# Patient Record
Sex: Male | Born: 1955 | Race: White | Hispanic: No | Marital: Married | Smoking: Former smoker
Health system: Southern US, Community
[De-identification: ages and names within clinical notes are randomized; demographics above are authoritative.]

## PROBLEM LIST (undated history)

## (undated) DIAGNOSIS — N189 Chronic kidney disease, unspecified: Secondary | ICD-10-CM

## (undated) DIAGNOSIS — G20A1 Parkinson's disease without dyskinesia, without mention of fluctuations: Secondary | ICD-10-CM

## (undated) DIAGNOSIS — J449 Chronic obstructive pulmonary disease, unspecified: Secondary | ICD-10-CM

## (undated) DIAGNOSIS — N200 Calculus of kidney: Secondary | ICD-10-CM

## (undated) DIAGNOSIS — E119 Type 2 diabetes mellitus without complications: Secondary | ICD-10-CM

## (undated) DIAGNOSIS — R42 Dizziness and giddiness: Secondary | ICD-10-CM

## (undated) DIAGNOSIS — F419 Anxiety disorder, unspecified: Secondary | ICD-10-CM

## (undated) DIAGNOSIS — G20 Parkinson's disease: Secondary | ICD-10-CM

## (undated) HISTORY — DX: Chronic kidney disease, unspecified: N18.9

## (undated) HISTORY — DX: Type 2 diabetes mellitus without complications: E11.9

## (undated) HISTORY — DX: Anxiety disorder, unspecified: F41.9

## (undated) HISTORY — DX: Parkinson's disease: G20

## (undated) HISTORY — PX: HYDROCELE EXCISION: SHX482

## (undated) HISTORY — DX: Dizziness and giddiness: R42

## (undated) HISTORY — DX: Calculus of kidney: N20.0

## (undated) HISTORY — DX: Chronic obstructive pulmonary disease, unspecified: J44.9

## (undated) HISTORY — DX: Parkinson's disease without dyskinesia, without mention of fluctuations: G20.A1

---

## 2013-08-15 DIAGNOSIS — J40 Bronchitis, not specified as acute or chronic: Secondary | ICD-10-CM | POA: Insufficient documentation

## 2013-08-15 DIAGNOSIS — L03119 Cellulitis of unspecified part of limb: Secondary | ICD-10-CM | POA: Insufficient documentation

## 2013-08-15 DIAGNOSIS — L02419 Cutaneous abscess of limb, unspecified: Secondary | ICD-10-CM | POA: Insufficient documentation

## 2013-08-15 DIAGNOSIS — R000 Tachycardia, unspecified: Secondary | ICD-10-CM | POA: Insufficient documentation

## 2015-09-15 DIAGNOSIS — B029 Zoster without complications: Secondary | ICD-10-CM | POA: Insufficient documentation

## 2015-09-15 DIAGNOSIS — F410 Panic disorder [episodic paroxysmal anxiety] without agoraphobia: Secondary | ICD-10-CM | POA: Insufficient documentation

## 2015-09-15 DIAGNOSIS — E1129 Type 2 diabetes mellitus with other diabetic kidney complication: Secondary | ICD-10-CM | POA: Insufficient documentation

## 2015-09-15 DIAGNOSIS — G950 Syringomyelia and syringobulbia: Secondary | ICD-10-CM | POA: Insufficient documentation

## 2016-02-17 DIAGNOSIS — K219 Gastro-esophageal reflux disease without esophagitis: Secondary | ICD-10-CM | POA: Insufficient documentation

## 2016-03-16 DIAGNOSIS — E1065 Type 1 diabetes mellitus with hyperglycemia: Secondary | ICD-10-CM | POA: Insufficient documentation

## 2016-03-16 DIAGNOSIS — E785 Hyperlipidemia, unspecified: Secondary | ICD-10-CM | POA: Insufficient documentation

## 2016-03-16 DIAGNOSIS — J9601 Acute respiratory failure with hypoxia: Secondary | ICD-10-CM | POA: Insufficient documentation

## 2016-08-13 DIAGNOSIS — J432 Centrilobular emphysema: Secondary | ICD-10-CM | POA: Insufficient documentation

## 2017-05-31 DIAGNOSIS — N433 Hydrocele, unspecified: Secondary | ICD-10-CM | POA: Insufficient documentation

## 2017-11-28 DIAGNOSIS — M67442 Ganglion, left hand: Secondary | ICD-10-CM | POA: Insufficient documentation

## 2017-12-12 DIAGNOSIS — J449 Chronic obstructive pulmonary disease, unspecified: Secondary | ICD-10-CM | POA: Insufficient documentation

## 2018-11-12 DIAGNOSIS — K529 Noninfective gastroenteritis and colitis, unspecified: Secondary | ICD-10-CM | POA: Insufficient documentation

## 2019-04-23 DIAGNOSIS — H8113 Benign paroxysmal vertigo, bilateral: Secondary | ICD-10-CM | POA: Insufficient documentation

## 2019-04-23 DIAGNOSIS — K521 Toxic gastroenteritis and colitis: Secondary | ICD-10-CM | POA: Insufficient documentation

## 2019-09-30 DIAGNOSIS — J189 Pneumonia, unspecified organism: Secondary | ICD-10-CM | POA: Insufficient documentation

## 2019-09-30 DIAGNOSIS — E612 Magnesium deficiency: Secondary | ICD-10-CM | POA: Insufficient documentation

## 2019-09-30 DIAGNOSIS — Z8673 Personal history of transient ischemic attack (TIA), and cerebral infarction without residual deficits: Secondary | ICD-10-CM | POA: Insufficient documentation

## 2019-09-30 DIAGNOSIS — N179 Acute kidney failure, unspecified: Secondary | ICD-10-CM | POA: Insufficient documentation

## 2019-09-30 DIAGNOSIS — I700 Atherosclerosis of aorta: Secondary | ICD-10-CM | POA: Insufficient documentation

## 2019-09-30 DIAGNOSIS — E0810 Diabetes mellitus due to underlying condition with ketoacidosis without coma: Secondary | ICD-10-CM | POA: Insufficient documentation

## 2019-09-30 DIAGNOSIS — I6503 Occlusion and stenosis of bilateral vertebral arteries: Secondary | ICD-10-CM | POA: Insufficient documentation

## 2019-12-23 DIAGNOSIS — M7542 Impingement syndrome of left shoulder: Secondary | ICD-10-CM | POA: Insufficient documentation

## 2020-01-11 DIAGNOSIS — R296 Repeated falls: Secondary | ICD-10-CM | POA: Insufficient documentation

## 2020-01-11 DIAGNOSIS — S3011XA Contusion of abdominal wall, initial encounter: Secondary | ICD-10-CM | POA: Insufficient documentation

## 2020-01-11 DIAGNOSIS — S301XXA Contusion of abdominal wall, initial encounter: Secondary | ICD-10-CM | POA: Insufficient documentation

## 2020-01-11 DIAGNOSIS — R251 Tremor, unspecified: Secondary | ICD-10-CM | POA: Insufficient documentation

## 2020-02-04 DIAGNOSIS — N183 Chronic kidney disease, stage 3 unspecified: Secondary | ICD-10-CM | POA: Insufficient documentation

## 2020-02-04 DIAGNOSIS — I129 Hypertensive chronic kidney disease with stage 1 through stage 4 chronic kidney disease, or unspecified chronic kidney disease: Secondary | ICD-10-CM | POA: Insufficient documentation

## 2020-02-04 DIAGNOSIS — F3341 Major depressive disorder, recurrent, in partial remission: Secondary | ICD-10-CM | POA: Insufficient documentation

## 2020-03-17 DIAGNOSIS — E538 Deficiency of other specified B group vitamins: Secondary | ICD-10-CM | POA: Insufficient documentation

## 2020-03-17 DIAGNOSIS — D649 Anemia, unspecified: Secondary | ICD-10-CM | POA: Insufficient documentation

## 2020-06-01 DIAGNOSIS — G20 Parkinson's disease: Secondary | ICD-10-CM | POA: Insufficient documentation

## 2020-06-01 DIAGNOSIS — I639 Cerebral infarction, unspecified: Secondary | ICD-10-CM | POA: Insufficient documentation

## 2020-06-08 DIAGNOSIS — N3942 Incontinence without sensory awareness: Secondary | ICD-10-CM | POA: Insufficient documentation

## 2020-10-06 DIAGNOSIS — I771 Stricture of artery: Secondary | ICD-10-CM | POA: Insufficient documentation

## 2021-03-27 ENCOUNTER — Other Ambulatory Visit: Payer: Self-pay

## 2021-03-27 ENCOUNTER — Encounter: Payer: Self-pay | Admitting: Neurology

## 2021-03-27 NOTE — Progress Notes (Signed)
Assessment/Plan:   1.  Parkinsonism, likely Parkinson's disease but atypical state cannot be ruled out  -Compliance is an issue here, as patient is not taking levodopa as directed.  He and I had a long discussion about this.  While he may be underdosed, it is difficult to say that is the case given the fact that he really has not taken medication since 6 AM and was seen in the middle of the afternoon.  I am going to try to slightly increase his medication, but told him he needed to take it at 8 AM/noon/4 PM.  He will take carbidopa/levodopa 25/100, 2 tablets at 8 AM/2 tablets at noon/1 tablet at 4 PM.  Discussed r/b/se.  Discussed potential interactions with protein.  -Discussed importance of safe, cardiovascular exercise.  He has therapy in the home right now.  -Discussed with them that Depakote could be making his tremor worse.  Wife states that he was on it for seizure prevention (no history of seizure).  He uses weighted spoons.  I am not sure that he needs the Depakote, but we will leave this to prescribing provider.  2.  Hyperreflexia with frank urinary incontinence (no sensation that he needs to go)   -Was not myelopathic on examination, but did have significant hyperreflexia.  We will do MRI of the cervical spine.  -We will do a redo MRI of the brain given multiple falls and cognitive concerns.  3.  Memory change  -Medications could be playing a role.  We discussed this today.  -Discussed that fluctuating blood sugars could be playing a role as well.  Patient sometimes does not take his diabetes medicines.  -Certainly, PDD could be an issue here as well.  Decided to hold off on neurocognitive testing until we can change his meds, as above.   4.  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  Patient on aspirin monotherapy.  Patient with multiple stroke risk factors including hypertension, hyperlipidemia, diabetes mellitus,  history of tobacco.  5.  Patient's wife does not like driving to [place] and patient currently not driving.  They are going to come back here 1 or 2 more times, primarily for follow-up on MRI and medication change.  After that, patient would like to follow back up in [facility] with Quinn Plowman  Subjective:   Bernard Evans was seen today in the movement disorders clinic for neurologic consultation at the request of Dan Maker., MD.  The consultation is for the evaluation of Parkinson's disease.  Pt with wife who supplements hx.   Patient previously seen by the neurology PA in [facility] as well as Dr. Jobe Gibbon.  Patient first and only visit by Dr. Jobe Gibbon in April, 2022 because of patient's history of stroke and atherosclerosis.  He is a vascular neurologist.  Patient has a history of hypertension, hyperlipidemia, diabetes mellitus and right centrum semiovale stroke in July, 2021.  Aspirin monotherapy for his stroke was recommended.  During that visit in April, 2022, Dr. Jobe Gibbon noted that the patient had falls, resting tremor and he was started on levodopa and a referral was made to the movement clinic at [facility].  He never went there.  He followed back up with Quinn Plowman in August, 2022, at which point he noted that tremor was about 20% better since starting levodopa.  She referred him here at that time, but patient declined the referral then when we contacted them.  Current movement d/o meds: carbidopa/levodopa 25/100, 1 at 6am, 1 at noon, 1 at 6pm - cannot tell wearing off - most of the time he isn't taking the middle of the day dosage and sometimes will take 1 in the morning and 2 at night.  He generally will go back to bed after his wife gives him the morning medicine.  Tremor: Yes.     How long has it been going on? A year  At rest or with activation?  activation  Fam hx of tremor?  Maternal aunt with Parkinsons Disease   Located where?  R hand per patient but wife notes L as  well (pt is R hand dominant)  Affected by caffeine:  No.  Affected by alcohol:  doesn't drink any  Affected by stress:  No.  Affected by fatigue:  No.  Spills soup if on spoon:  Yes.    Spills glass of liquid if full: may or may not   Other Specific Symptoms:  Voice: no change Sleep:   Vivid Dreams:  No.  Acting out dreams:  No. Wet Pillows: No. Postural symptoms:  Yes.  , some due to knee issues, some due to hx of stroke (balance not good after)  Falls?  Yes.  , last fall was last week - fell off kitchen chair.  May go for few weeks without a fall and then may have a few falls in a row Bradykinesia symptoms: slow movements, slowed thought processes, and difficulty getting out of a chair; no shuffle Loss of smell:  No. Loss of taste:  No. Urinary Incontinence:  Yes.  , doesn't see urology lately; wears undergarment.  No sense he has to go Difficulty Swallowing:  No. Handwriting, micrographia: No. Trouble with ADL's:  Yes.   - needs trouble with socks/pants/shoes  Trouble buttoning clothing: Yes.   Depression:  wife states he is "obviously depressed" Memory changes:  Yes.   Since stroke in 2021 Hallucinations:  No.  visual distortions: No. N/V:  No. Lightheaded:  No.  Syncope: No. Diplopia:  No. Dyskinesia:  No.   ALLERGIES:   Allergies  Allergen Reactions   Guaifenesin Shortness Of Breath   Codeine Anxiety    CURRENT MEDICATIONS:  Current Outpatient Medications  Medication Instructions   albuterol (VENTOLIN HFA) 108 (90 Base) MCG/ACT inhaler Inhalation   ALPRAZolam (XANAX) 0.25 MG tablet Oral   amLODipine (NORVASC) 2.5 MG tablet 1 tablet, Oral, Daily   aspirin 325 MG EC tablet 1 tablet, Oral, Daily   Blood Glucose Monitoring Suppl (FIFTY50 GLUCOSE METER 2.0) w/Device KIT See admin instructions   carbidopa-levodopa (SINEMET IR) 25-100 MG tablet 1 tablet, Oral, 3 times daily   cyanocobalamin 1000 MCG tablet Oral   divalproex (DEPAKOTE ER) 500 MG 24 hr tablet 2  tablets, Oral, Daily   empagliflozin (JARDIANCE) 10 MG TABS tablet Oral   ergocalciferol (VITAMIN D2) 1.25 MG (50000 UT) capsule 1 capsule, Oral, Weekly   gabapentin (NEURONTIN) 300 MG capsule TAKE 1 CAPSULE BY MOUTH ONCE DAILY IN THE MORNING AND 2 NIGHTLY   Glycopyrrolate-Formoterol (BEVESPI AEROSPHERE) 9-4.8 MCG/ACT AERO Inhalation   insulin degludec (TRESIBA) 100 UNIT/ML FlexTouch Pen Inject 16 units daily and 10 units at bedtime   insulin glargine (LANTUS SOLOSTAR) 100 UNIT/ML Solostar Pen Increase dose by 5 unit wkly until fasting around 120.   losartan (COZAAR) 50 MG tablet 1 tablet, Oral, 2 times daily   magnesium oxide (MAG-OX) 400 MG tablet Take 1 tablet by mouth on Monday, Wednesday and  Friday.   metFORMIN (GLUCOPHAGE-XR) 500 MG 24 hr tablet 2 tablets, Oral, Daily with breakfast   metoprolol succinate (TOPROL-XL) 25 MG 24 hr tablet 2 tablets, Oral, Daily   pantoprazole (PROTONIX) 40 MG tablet 1 tablet, Oral, Daily   rosuvastatin (CRESTOR) 20 MG tablet 1 tablet, Oral, Daily   venlafaxine (EFFEXOR) 75 MG tablet Take by mouth.    Objective:   PHYSICAL EXAMINATION:    VITALS:   Vitals:   03/28/21 1308  BP: 119/69  Pulse: 69  SpO2: 98%  Weight: 180 lb (81.6 kg)  Height: $Remove'5\' 9"'IYiUFyW$  (1.753 m)    GEN:  The patient appears stated age and is in NAD. HEENT:  Normocephalic, atraumatic.  The mucous membranes are moist. The superficial temporal arteries are without ropiness or tenderness. CV:  RRR Lungs:  CTAB Neck/HEME:  There are no carotid bruits bilaterally.  Pt did not take the carbidopa/levodopa 25/100 since early this AM (6am) and was seen at 1:30pm.  Neurological examination:  Orientation: The patient is alert and oriented x3.  Patient looks to wife for finer aspects of the history. Cranial nerves: There is good facial symmetry.  There is facial hypomimia.  Extraocular muscles are intact. The visual fields are full to confrontational testing. The speech is fluent and clear.  Soft palate rises symmetrically and there is no tongue deviation. Hearing is intact to conversational tone. Sensation: Sensation is intact to light touch throughout (facial, trunk, extremities). Vibration is intact at the bilateral big toe. There is no extinction with double simultaneous stimulation.  Motor: Strength is 5/5 in the bilateral upper and lower extremities.   Shoulder shrug is equal and symmetric.  There is no pronator drift. Deep tendon reflexes: Deep tendon reflexes are 3+/4 at the bilateral biceps, triceps, brachioradialis, 2+ at the bilateralpatella and achilles. Plantar responses are downgoing bilaterally.  Movement examination: Tone: There is mild increased tone in the RUE/RLE.  While the left arm is held in a flexed posture, it really is not rigid. Abnormal movements: there is RUE rest tremor that increases with distraction Coordination:  There is  decremation with RAM's, with finger taps and hand opening and closing on the right.  He has trouble with toe and heel taps more on the left than the right. Gait and Station: The patient has difficulty arising out of a deep-seated chair without the use of the hands and is unable to do that.  He ultimately pushes off of the chair to arise. The patient is slow with his cane, but when the cane is taken away, he becomes much more short stepped and flexed at the waist and starts to festinate some.   Total time spent on today's visit was 39minutes, including both face-to-face time and nonface-to-face time.  Time included that spent on review of records (prior notes available to me/labs/imaging if pertinent), discussing treatment and goals, answering patient's questions and coordinating care.  Cc:  Myrtis Hopping., MD

## 2021-03-28 ENCOUNTER — Encounter: Payer: Self-pay | Admitting: Neurology

## 2021-03-28 ENCOUNTER — Ambulatory Visit (INDEPENDENT_AMBULATORY_CARE_PROVIDER_SITE_OTHER): Payer: Medicare Other | Admitting: Neurology

## 2021-03-28 ENCOUNTER — Other Ambulatory Visit: Payer: Self-pay

## 2021-03-28 VITALS — BP 119/69 | HR 69 | Ht 69.0 in | Wt 180.0 lb

## 2021-03-28 DIAGNOSIS — G20 Parkinson's disease: Secondary | ICD-10-CM

## 2021-03-28 DIAGNOSIS — W19XXXA Unspecified fall, initial encounter: Secondary | ICD-10-CM | POA: Diagnosis not present

## 2021-03-28 DIAGNOSIS — R410 Disorientation, unspecified: Secondary | ICD-10-CM

## 2021-03-28 DIAGNOSIS — R32 Unspecified urinary incontinence: Secondary | ICD-10-CM

## 2021-03-28 DIAGNOSIS — R292 Abnormal reflex: Secondary | ICD-10-CM

## 2021-03-28 MED ORDER — CARBIDOPA-LEVODOPA 25-100 MG PO TABS: ORAL_TABLET | ORAL | 0 refills | Status: DC

## 2021-03-28 NOTE — Patient Instructions (Addendum)
Carbidopa Levodopa 25/100 Take 2 tablets at 8AM / Take 2 tablets at 12PM and Take 1 tablet at 4PM  A referral to [facility] has been placed for your MRI someone will contact you directly to schedule your appt. They are located at [place]. Please contact them directly by calling [phone] with any questions regarding your referral.

## 2021-04-18 ENCOUNTER — Other Ambulatory Visit: Payer: Self-pay

## 2021-04-18 ENCOUNTER — Ambulatory Visit
Admission: RE | Admit: 2021-04-18 | Discharge: 2021-04-18 | Disposition: A | Discharge status: Home / Self Care | Payer: Medicare Other | Source: Ambulatory Visit | Attending: Neurology | Admitting: Neurology

## 2021-04-18 DIAGNOSIS — R292 Abnormal reflex: Secondary | ICD-10-CM

## 2021-04-18 DIAGNOSIS — R32 Unspecified urinary incontinence: Secondary | ICD-10-CM

## 2021-04-18 DIAGNOSIS — R410 Disorientation, unspecified: Secondary | ICD-10-CM

## 2021-04-18 DIAGNOSIS — W19XXXA Unspecified fall, initial encounter: Secondary | ICD-10-CM

## 2021-04-18 DIAGNOSIS — G20 Parkinson's disease: Secondary | ICD-10-CM

## 2021-04-18 IMAGING — MR MR CERVICAL SPINE W/O CM
4 of 5 series · 27 of 48 positions shown · non-contrast
Comparison: CT cervical spine [DATE].

CLINICAL DATA: Urinary incontinence, unspecified type R32
([RM]-CM). Hyperreflexia [RM] ([RM]-CM).

EXAM:
MRI CERVICAL SPINE WITHOUT CONTRAST
TECHNIQUE: Multiplanar, multisequence MR imaging of the cervical spine was
performed. No intravenous contrast was administered.

[Series 6: T2 · sagittal · 3.0mm · 0.55mm/px · 6 of 16 slices shown (1 of 2)]
[im 1/16]
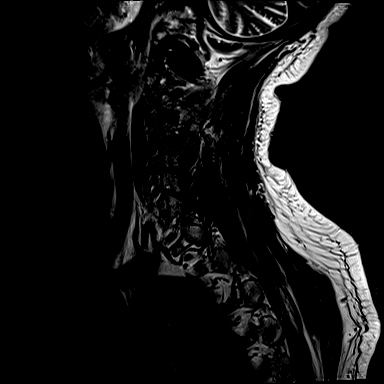
[im 4/16]
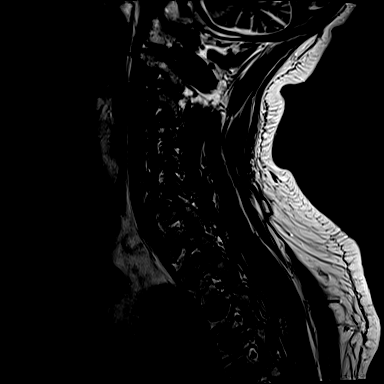
[im 7/16]
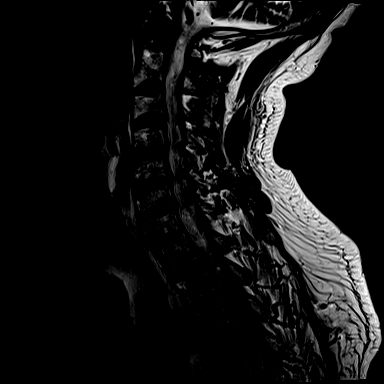
[im 10/16]
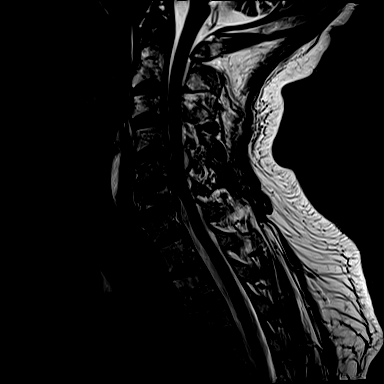
[im 13/16]
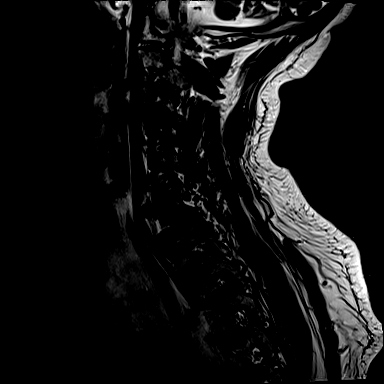
[im 16/16]
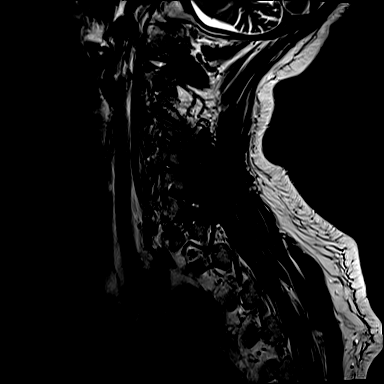

[Series 7: T1 · sagittal · 3.0mm · 0.66mm/px · 7 of 16 slices shown]
[im 1/16]
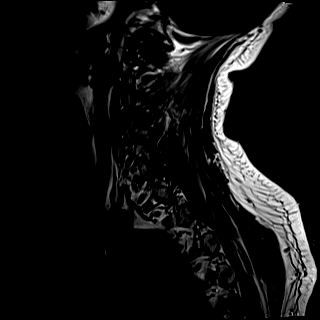
[im 3/16]
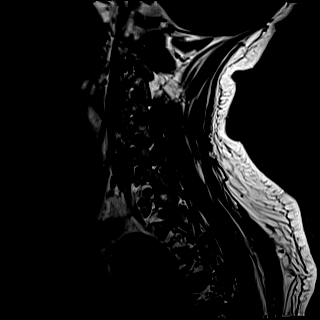
[im 6/16]
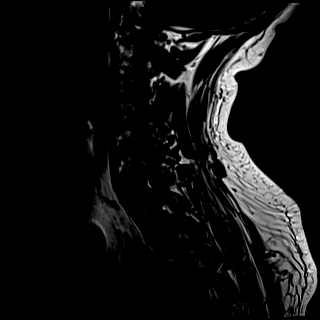
[im 8/16]
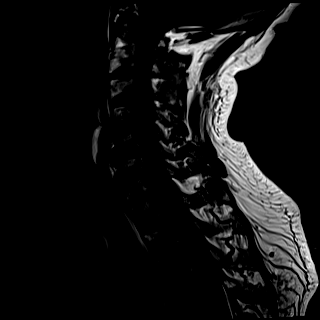
[im 11/16]
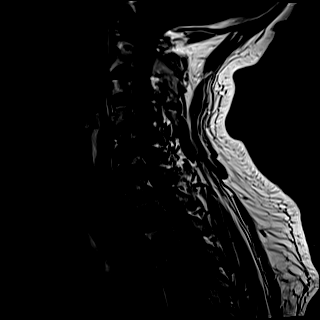
[im 13/16]
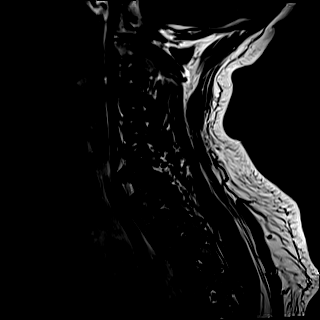
[im 16/16]
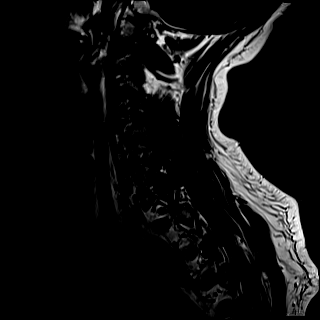

[Series 8: STIR · sagittal · 3.0mm · 0.33mm/px · 6 of 16 slices shown]
[im 1/16]
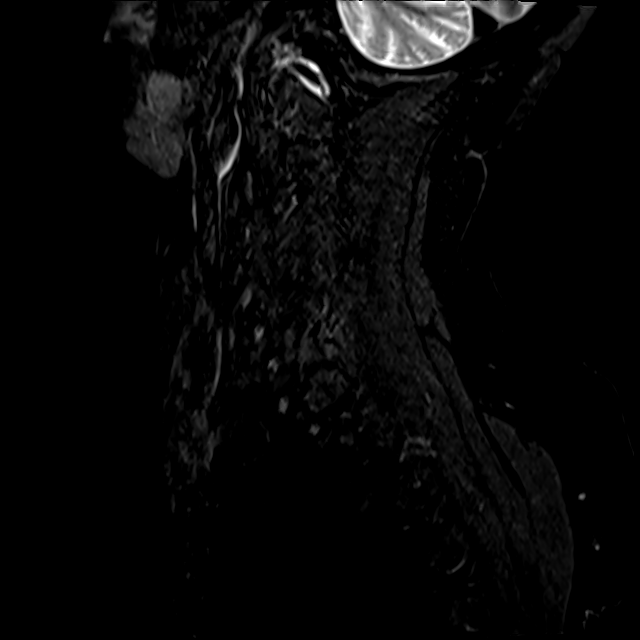
[im 3/16]
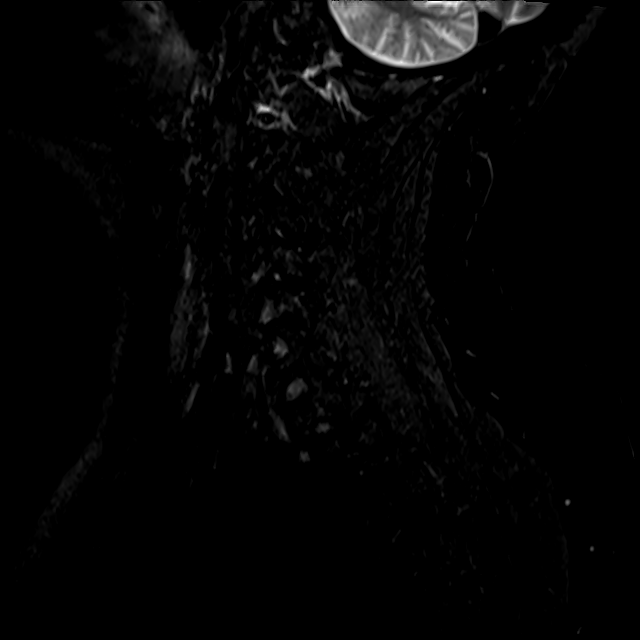
[im 6/16]
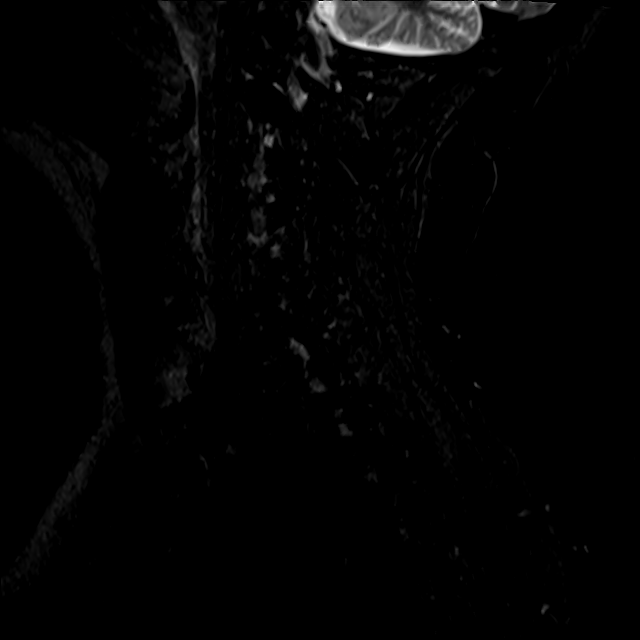
[im 8/16]
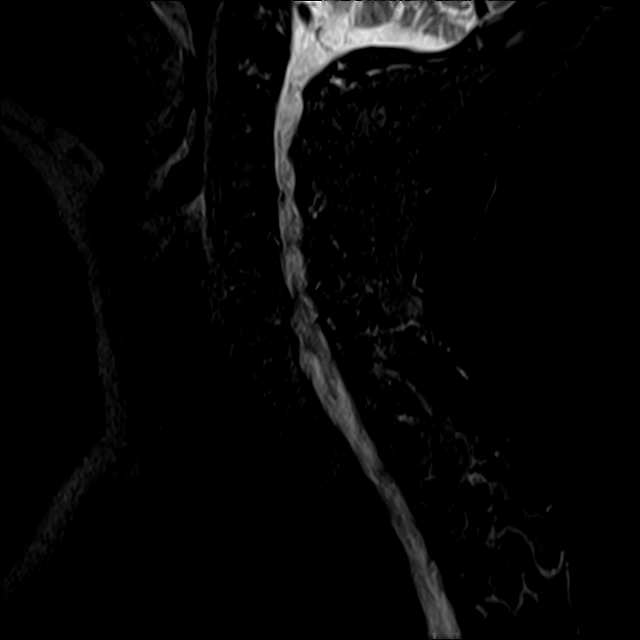
[im 11/16]
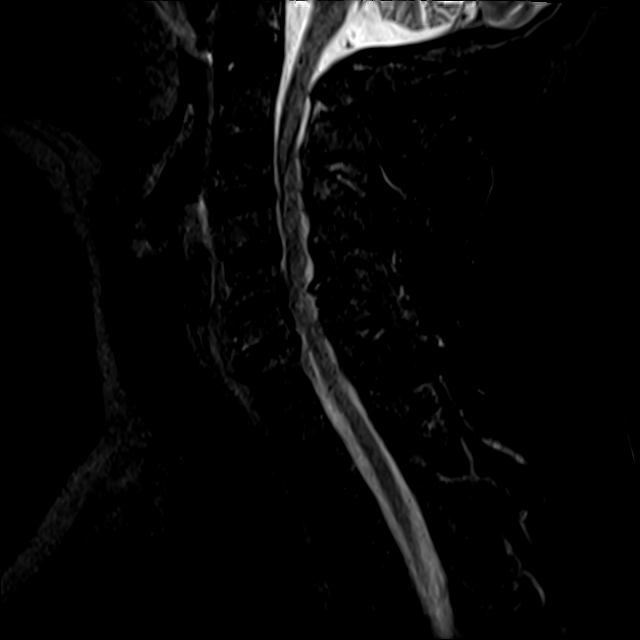
[im 13/16]
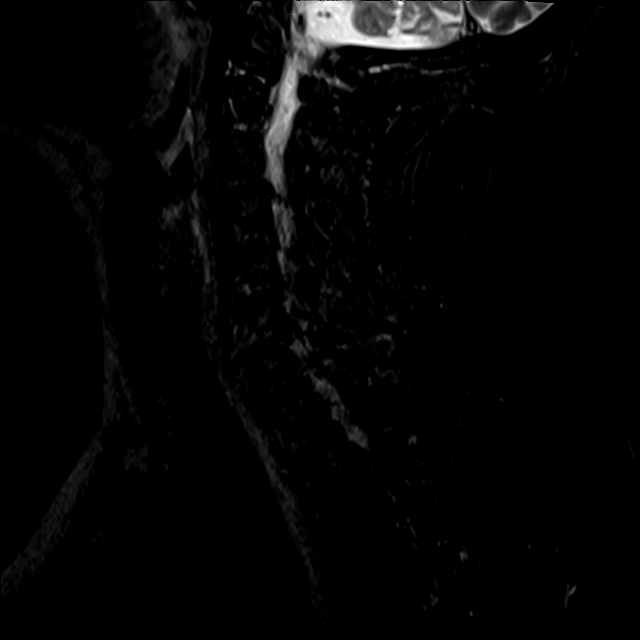

[Series 9: T2 · axial · 3.0mm · 0.50mm/px · z∈[-237,-144]mm · 8 of 31 slices shown (2 of 2)]
[im 1/31]
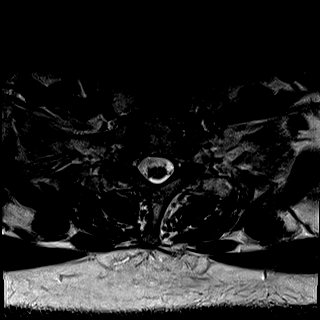
[im 5/31]
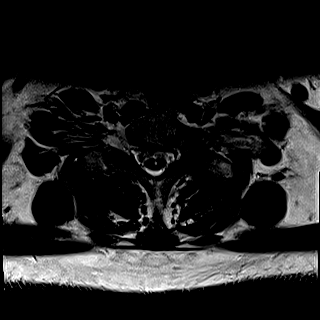
[im 10/31]
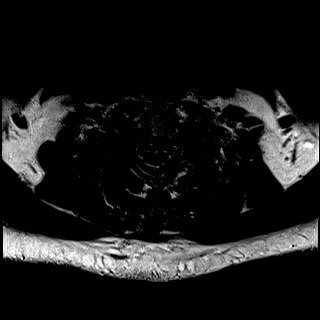
[im 14/31]
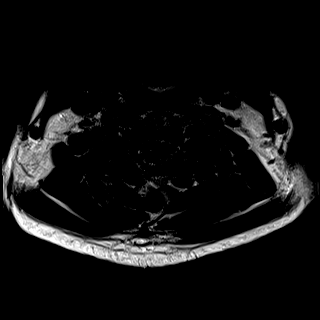
[im 17/31]
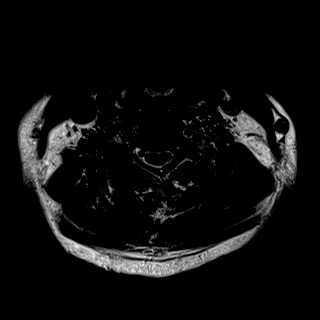
[im 21/31]
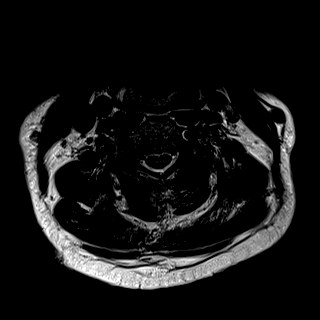
[im 26/31]
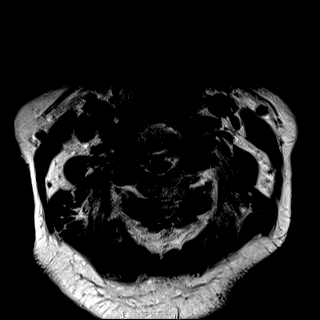
[im 31/31]
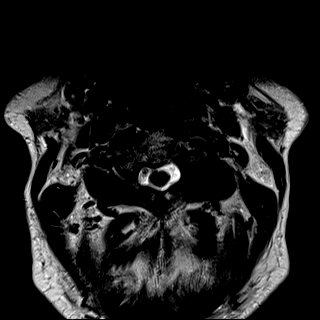

[27 of 48 positions shown; findings below may reference images not displayed]

FINDINGS: Alignment: Physiologic.

Vertebrae: No fracture or evidence of discitis. Ill-defined
hypointense focus in all sequences in the T2 vertebral body may
represent focal red marrow.

Cord: Persistent central canal seen at the C7-T1 level.

Posterior Fossa, vertebral arteries, paraspinal tissues: Negative.

Disc levels:

C2-3: Tiny central disc protrusion. No significant spinal canal or
neural foraminal stenosis.

C3-4: Posterior disc osteophyte complex resulting in mild spinal
canal stenosis. Uncovertebral and facet degenerative changes
resulting in mild-to-moderate right and moderate left neural
foraminal narrowing.

C4-5: Posterior disc osteophyte complex resulting in mild spinal
canal stenosis. Uncovertebral and facet degenerative changes
resulting mild right and moderate left neural foraminal narrowing.

C5-6: Posterior disc osteophyte complex resulting in mild spinal
canal stenosis. Uncovertebral and facet degenerative changes
resulting in moderate bilateral neural foraminal narrowing.

C6-7: Posterior disc osteophyte complex asymmetric to the left
without significant spinal canal stenosis. Uncovertebral and facet
degenerative change resulting in mild right and moderate left neural
foraminal narrowing.

C7-T1: Uncovertebral and facet degenerative changes resulting in
mild right and moderate left neural foraminal narrowing. No spinal
canal stenosis.
IMPRESSION: 1. Degenerative changes of the cervical spine with mild spinal canal
stenosis from C3-4 through C5-6.
2. Multilevel neural foraminal narrowing, moderate bilaterally at
C5-6 and on the left at C4-5, C6-7 and C7-T1.

## 2021-04-18 IMAGING — MR MR HEAD W/O CM
12 series · 48 of 48 positions shown · non-contrast
Comparison: Head CT [DATE]; MRI of the brain [DATE].

CLINICAL DATA: Parkinson's disease (HCC) G20 ([5S]-CM). Confusion
[5S] ([5S]-CM). Fall, initial encounter W19.XXXA ([5S]-CM).
Hyperreflexia [5S] ([5S]-CM).

EXAM:
MRI HEAD WITHOUT CONTRAST
TECHNIQUE: Multiplanar, multiecho pulse sequences of the brain and surrounding
structures were obtained without intravenous contrast.

[Series 5: T1 · sagittal · 4.0mm · 0.75mm/px · 1 of 32 slices shown (1 of 2)]
[im 1/32]
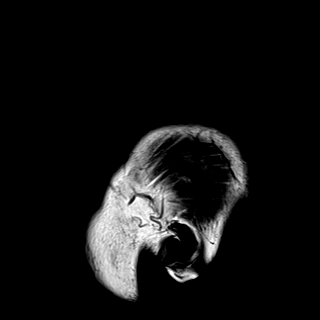

[Series 6: DWI · axial · 3.0mm · 0.94mm/px · z∈[-59,+88]mm · 9 of 168 slices shown (1 of 3)]
[im 1/168]
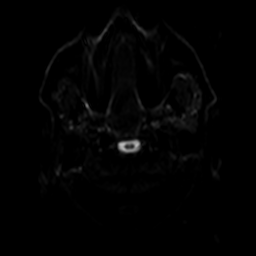
[im 21/168]
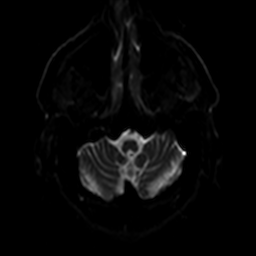
[im 42/168]
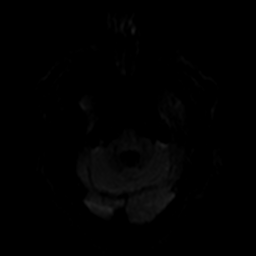
[im 63/168]
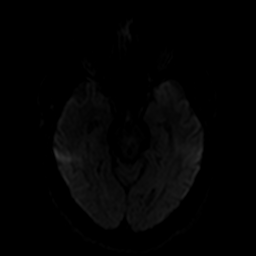
[im 84/168]
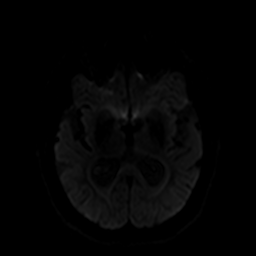
[im 105/168]
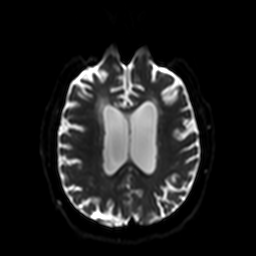
[im 126/168]
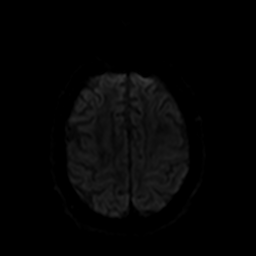
[im 147/168]
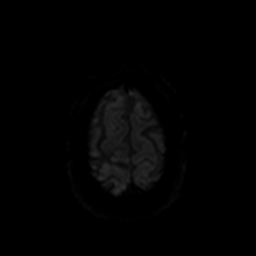
[im 168/168]
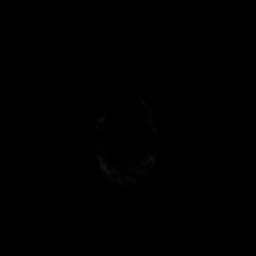

[Series 7: ax dwi_tracew · axial · 3.0mm · 0.94mm/px · z∈[-59,+88]mm · 5 of 84 slices shown]
[im 1/84]
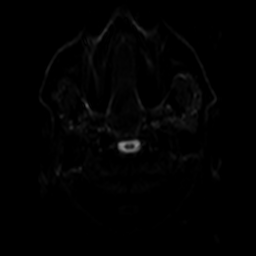
[im 21/84]
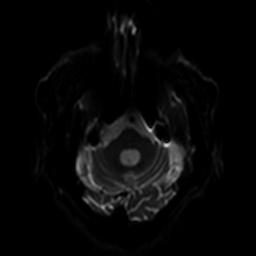
[im 42/84]
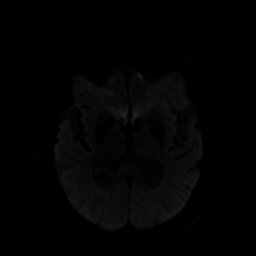
[im 63/84]
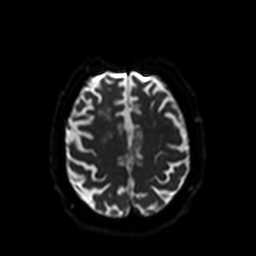
[im 84/84]
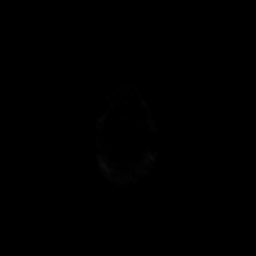

[Series 8: ax dwi_adc · axial · 3.0mm · 0.94mm/px · z∈[-59,+88]mm · 2 of 43 slices shown]
[im 1/43]
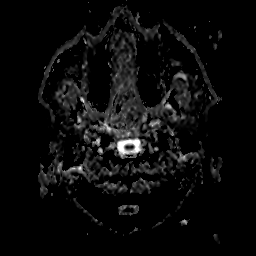
[im 43/43]
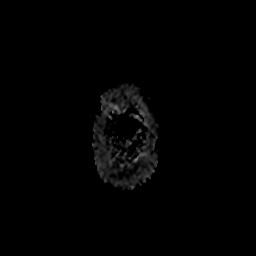

[Series 9: DWI · coronal · 5.0mm · 1.44mm/px · 4 of 64 slices shown (2 of 3)]
[im 1/64]
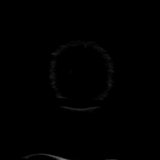
[im 22/64]
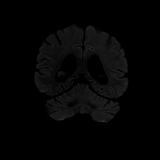
[im 43/64]
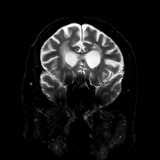
[im 64/64]
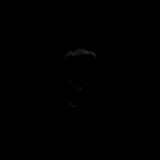

[Series 10: DWI · coronal · 5.0mm · 1.44mm/px · 2 of 32 slices shown (3 of 3)]
[im 1/32]
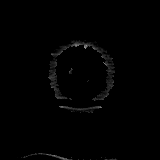
[im 32/32]
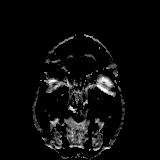

[Series 11: T2 · axial · 4.0mm · 0.36mm/px · z∈[-61,+91]mm · 2 of 31 slices shown]
[im 1/31]
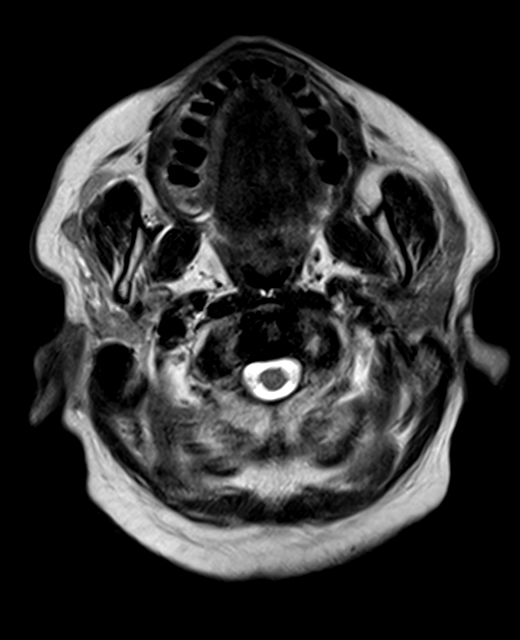
[im 31/31]
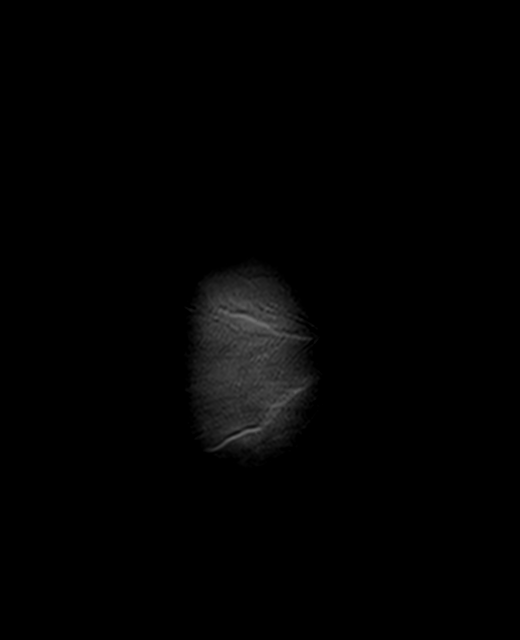

[Series 12: FLAIR · axial · 3.0mm · 0.72mm/px · 1 of 26 slices shown]
[im 1/26]
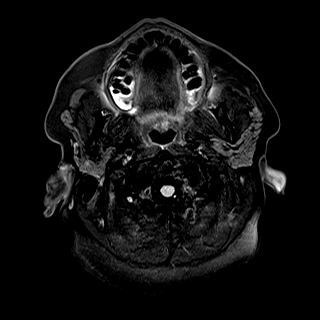

[Series 13: swi_images · axial · 1.5mm · 0.90mm/px · z∈[-60,+90]mm · 6 of 104 slices shown]
[im 1/104]
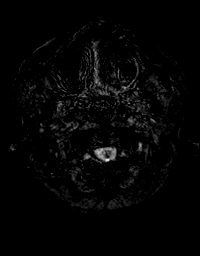
[im 21/104]
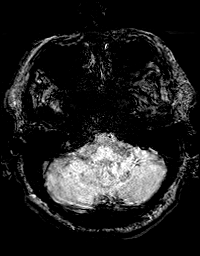
[im 42/104]
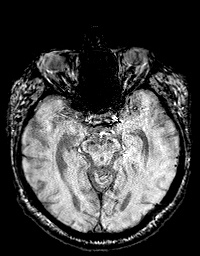
[im 62/104]
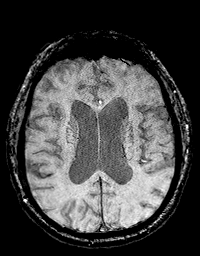
[im 83/104]
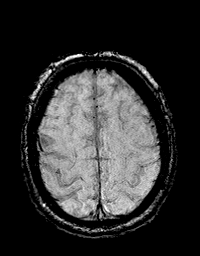
[im 104/104]
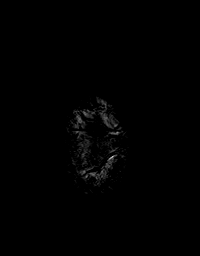

[Series 14: mip_images(sw) · axial · 12.0mm · 0.90mm/px · z∈[-55,+85]mm · 5 of 97 slices shown]
[im 1/97]
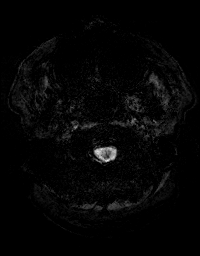
[im 25/97]
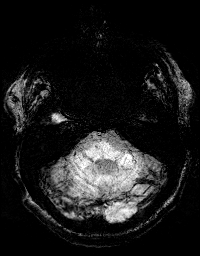
[im 49/97]
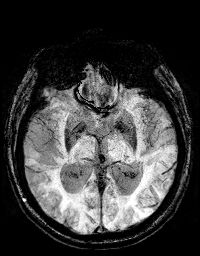
[im 73/97]
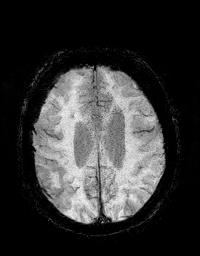
[im 97/97]
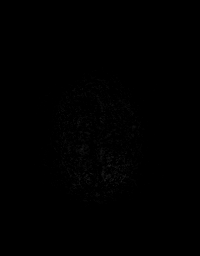

[Series 15: T1 · axial · 1.0mm · 0.94mm/px · z∈[-61,+93]mm · 9 of 160 slices shown (2 of 2)]
[im 1/160]
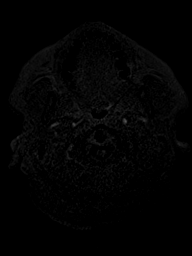
[im 20/160]
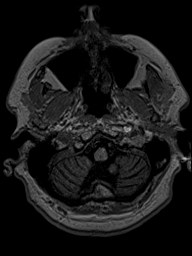
[im 40/160]
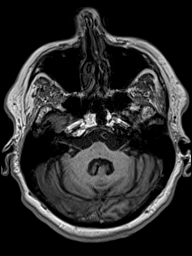
[im 60/160]
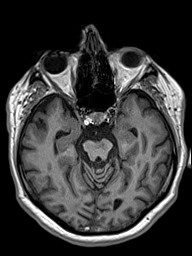
[im 80/160]
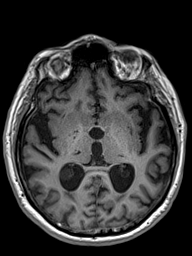
[im 100/160]
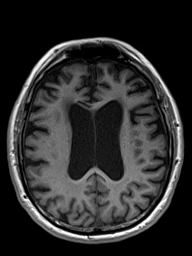
[im 120/160]
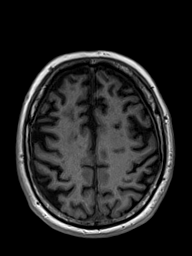
[im 140/160]
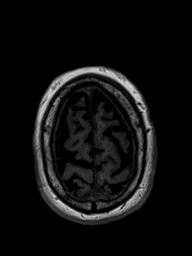
[im 160/160]
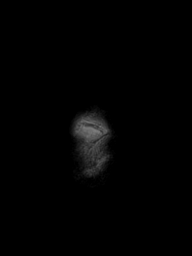

[Series 16: T2 post-contrast · coronal · 4.0mm · 0.36mm/px · 2 of 35 slices shown]
[im 1/35]
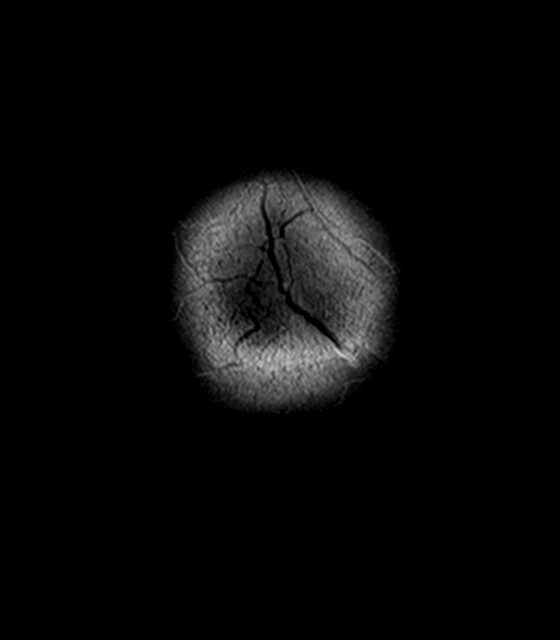
[im 35/35]
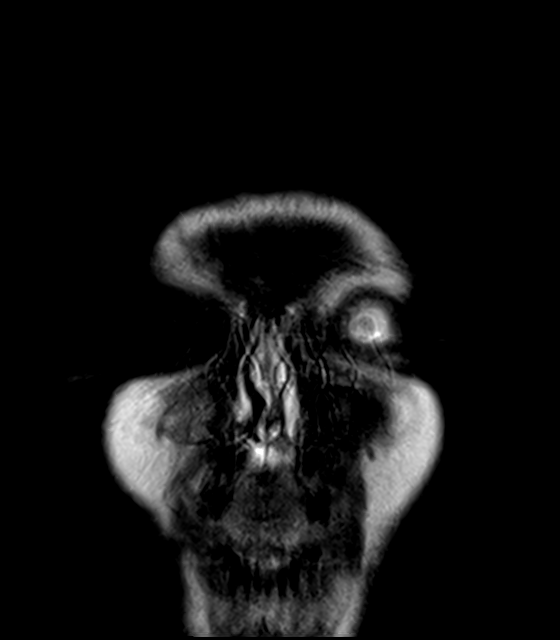

[48 of 48 positions shown; findings below may reference images not displayed]

FINDINGS: Brain: No acute infarction, hemorrhage, hydrocephalus, extra-axial
collection or mass lesion. Remote lacunar infarcts within the left
cerebellar hemisphere, pons, thalami, basal ganglia, centrum
semiovale and corona radiata as well as genu and splenium of the
corpus callosum. Infarct in the genu of the corpus callosum is new
since prior MRI. Scattered and confluent foci of T2 hyperintensity
are seen within the white matter of the cerebral hemispheres,
nonspecific, most likely related to chronic small vessel ischemia.
Moderate parenchymal volume loss.

Vascular: Absent flow void in the distal V4 segment of the left
vertebral artery, consistent with occlusion as seen on prior CT
angiogram.

Skull and upper cervical spine: Normal marrow signal.

Sinuses/Orbits: Negative.

Other: None.
IMPRESSION: 1. No acute intracranial abnormality.
2. Multiple remote small infarcts with at least 1 new infarct since
MRI performed in [DATE]. Moderate chronic microvascular ischemic changes of the white
matter and parenchymal volume loss.

## 2021-04-20 ENCOUNTER — Other Ambulatory Visit: Payer: Self-pay

## 2021-04-20 DIAGNOSIS — R32 Unspecified urinary incontinence: Secondary | ICD-10-CM

## 2021-05-31 NOTE — Progress Notes (Signed)
? ? ?Assessment/Plan:  ? ?1.  Parkinsonism, likely Parkinson's disease but atypical state cannot be ruled out ?            -Clinically, I still think he likely has Parkinson's disease.  He needs to increase his exercise.  We talked about this extensively.   ?-He will take carbidopa/levodopa 25/100, 2 tablets at 8 AM/2 tablets at noon/1 tablet at 4 PM.  Discussed r/b/se.  Discussed potential interactions with protein. ?-I will add pramipexole and work to 0.5 mg 3 times per day.  R/b/se discussed with patient and wife. ?            -Discussed with them that Depakote could be making his tremor worse.  Wife states that he was on it for seizure prevention (no history of seizure).  He uses weighted spoons.  I am not sure that he needs the Depakote, but we will leave this to prescribing provider. ? -may do levodopa challenge in future. ?  ?  ?2.  Memory change ?            -Medications could be playing a role.  We discussed this today. ?            -Discussed that fluctuating blood sugars could be playing a role as well.  Patient sometimes does not take his diabetes medicines. ?            -We will watch closely as we add pramipexole.  Discussed with wife. ?  ?  ?3.  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            -Patient has seen stroke neurology at [facility].  Patient on aspirin monotherapy.  Patient with multiple stroke risk factors including hypertension, hyperlipidemia, diabetes mellitus, history of tobacco. ? -MRI brain in 2023 with evidence of multiple prior infarcts. ?  ?4.  After I had left and was standing in the hall, the patient's wife stated that they would like to stay here for the care and asked me if I could refill the Depakote.  Told her I would need to examine the chart, because I remember having reservations about the Depakote.  First, it can make tremor worse.  Second, he was told he was on it for seizure prevention when he had no history of seizure.  He was already having to use  weighted spoons because of tremor.  I am going to try to decrease this medication to 500 mg daily and potentially decrease this further in the future.  We will let his wife know. ? ? ?Subjective:  ? ?Bernard Evans was seen today in follow up for parkinsonism, likely Parkinson's disease.  My previous records were reviewed prior to todays visit as well as outside records available to me.  I slightly increased his levodopa last visit, but most importantly we talked about compliance with taking the medication, as that had been an issue.  Records from primary care from March 27 indicate that patient compliance continues to be an issue. pt has had falls since last visit.  With the one, he was getting up from the kitchen chair and fell backwards.  With one, he is trying to sit down on the toilet and missed it and fell. One other fall, he was getting out of the shower and slipped and fell.  Didn't get hurt.   Pt denies lightheadedness, near syncope.  No hallucinations.  He did have an MRI of the brain and  cervical spine done since last visit.  MRI cervical spine with evidence of degenerative changes, multilevel, with mild central canal stenosis.  There was moderate neuroforaminal stenosis at several levels.  MRI brain reviewed.  Intracranially, there was nothing acute.  There was moderate white matter disease.  There was multiple old infarcts noted.  I personally reviewed those images.  Wife states that the medication doesn't seem to help that much.   He is taking it regularly.  He is not exercising. ? ?Current prescribed movement disorder medications: ?Carbidopa/levodopa 25/100, 2 tablets at 8 AM/2 tablets at noon/1 tablet at 4 PM ? ? ?PREVIOUS MEDICATIONS: Sinemet ? ?ALLERGIES:   ?Allergies  ?Allergen Reactions  ? Guaifenesin Shortness Of Breath  ? Codeine Anxiety  ? ? ?CURRENT MEDICATIONS:  ?Current Meds  ?Medication Sig  ? albuterol (VENTOLIN HFA) 108 (90 Base) MCG/ACT inhaler Inhale into the lungs.  ? ALPRAZolam  (XANAX) 0.25 MG tablet Take by mouth.  ? aspirin 325 MG EC tablet Take 1 tablet by mouth daily.  ? Blood Glucose Monitoring Suppl (FIFTY50 GLUCOSE METER 2.0) w/Device KIT See admin instructions.  ? carbidopa-levodopa (SINEMET IR) 25-100 MG tablet Take 2 tablets at 8AM / Take 2 tablets at 12PM and Take 1 tablet at 4PM  ? cyanocobalamin 1000 MCG tablet Take by mouth.  ? divalproex (DEPAKOTE ER) 500 MG 24 hr tablet Take 2 tablets by mouth daily.  ? empagliflozin (JARDIANCE) 10 MG TABS tablet Take by mouth.  ? ergocalciferol (VITAMIN D2) 1.25 MG (50000 UT) capsule Take 1 capsule by mouth once a week.  ? gabapentin (NEURONTIN) 300 MG capsule TAKE 1 CAPSULE BY MOUTH ONCE DAILY IN THE MORNING AND 2 NIGHTLY  ? Glycopyrrolate-Formoterol (BEVESPI AEROSPHERE) 9-4.8 MCG/ACT AERO Inhale into the lungs.  ? insulin degludec (TRESIBA) 100 UNIT/ML FlexTouch Pen Inject 16 units daily and 10 units at bedtime  ? insulin glargine (LANTUS SOLOSTAR) 100 UNIT/ML Solostar Pen Increase dose by 5 unit wkly until fasting around 120.  ? losartan (COZAAR) 50 MG tablet Take 1 tablet by mouth 2 (two) times daily.  ? magnesium oxide (MAG-OX) 400 MG tablet Take 1 tablet by mouth on Monday, Wednesday and Friday.  ? metFORMIN (GLUCOPHAGE-XR) 500 MG 24 hr tablet Take 2 tablets by mouth daily with breakfast.  ? metoprolol succinate (TOPROL-XL) 25 MG 24 hr tablet Take 2 tablets by mouth daily.  ? pantoprazole (PROTONIX) 40 MG tablet Take 1 tablet by mouth daily.  ? rosuvastatin (CRESTOR) 20 MG tablet Take 1 tablet by mouth daily.  ? venlafaxine (EFFEXOR) 75 MG tablet Take by mouth.  ? ? ? ?Objective:  ? ?PHYSICAL EXAMINATION:   ? ?VITALS:   ?Vitals:  ? 06/05/21 1404  ?BP: 123/78  ?Pulse: 72  ?SpO2: 96%  ?Weight: 180 lb (81.6 kg)  ?Height: $RemoveB'5\' 7"'JyqVRsEq$  (1.702 m)  ? ? ?GEN:  The patient appears stated age and is in NAD. ?HEENT:  Normocephalic, atraumatic.  The mucous membranes are moist. The superficial temporal arteries are without ropiness or tenderness. ?CV:   RRR ?Lungs:  CTAB ?Neck/HEME:  There are no carotid bruits bilaterally. ? ?Neurological examination: ? ?Orientation: The patient is alert and oriented x3.  Patient looks to wife for finer aspects of the history. ?Cranial nerves: There is good facial symmetry.  There is facial hypomimia.  Extraocular muscles are intact. The visual fields are full to confrontational testing. The speech is fluent and clear. Soft palate rises symmetrically and there is no tongue deviation. Hearing is intact to  conversational tone. ?Sensation: Sensation is intact to light touch throughout  ?Motor: Strength is 5/5 in the bilateral upper and lower extremities.   Shoulder shrug is equal and symmetric.  There is no pronator drift. ?Deep tendon reflexes: Deep tendon reflexes are 3+/4 at the bilateral biceps, triceps, brachioradialis, 2+ at the bilateralpatella and achilles. Plantar responses are downgoing bilaterally. ?  ?Movement examination: ?Tone: There is nl tone in the UE/LE ?Abnormal movements: there is RUE rest tremor that increases with distraction ?Coordination:  There is  decremation with action of turning in light bulb on the L and toe taps on the L ?Gait and Station: The patient pushes off of the chair to arise. The patient is slow with his cane and short stepped. ? ? ? ? ?Total time spent on today's visit was 27minutes, including both face-to-face time and nonface-to-face time.  Time included that spent on review of records (prior notes available to me/labs/imaging if pertinent), discussing treatment and goals, answering patient's questions and coordinating care. ? ?Cc:  Myrtis Hopping., MD ? ?

## 2021-06-03 ENCOUNTER — Other Ambulatory Visit: Payer: Self-pay | Admitting: Neurology

## 2021-06-03 DIAGNOSIS — G20 Parkinson's disease: Secondary | ICD-10-CM

## 2021-06-05 ENCOUNTER — Ambulatory Visit (INDEPENDENT_AMBULATORY_CARE_PROVIDER_SITE_OTHER): Payer: Medicare Other | Admitting: Neurology

## 2021-06-05 ENCOUNTER — Other Ambulatory Visit: Payer: Self-pay

## 2021-06-05 ENCOUNTER — Telehealth: Payer: Self-pay | Admitting: Neurology

## 2021-06-05 ENCOUNTER — Encounter: Payer: Self-pay | Admitting: Neurology

## 2021-06-05 VITALS — BP 123/78 | HR 72 | Ht 67.0 in | Wt 180.0 lb

## 2021-06-05 DIAGNOSIS — G20 Parkinson's disease: Secondary | ICD-10-CM

## 2021-06-05 MED ORDER — PRAMIPEXOLE DIHYDROCHLORIDE 0.5 MG PO TABS: 0.5000 mg | ORAL_TABLET | Freq: Three times a day (TID) | ORAL | 1 refills | Status: DC

## 2021-06-05 MED ORDER — PRAMIPEXOLE DIHYDROCHLORIDE 0.125 MG PO TABS: ORAL_TABLET | ORAL | 0 refills | Status: DC

## 2021-06-05 NOTE — Telephone Encounter (Signed)
Called pateint left voicemail message  

## 2021-06-05 NOTE — Patient Instructions (Addendum)
Start mirapex (pramipexole) as follows:  0.125 mg - 1 tablet three times per day for a week, then 2 tablets three times per day for a week and then fill the 0.5 mg tablet and take that, 1 pill three times per day  ? ?Let us know if you want a referral for PT. ? ? ? ?

## 2021-06-05 NOTE — Telephone Encounter (Signed)
After I had left and was standing in the hall, the patient's wife stated that they would like to stay here for the care and asked me if I could refill the Depakote.  Told her I would need to examine the chart, because I remember having reservations about the Depakote.  First, it can make tremor worse.  Second, he was told he was on it for seizure prevention when he had no history of seizure.  He was already having to use weighted spoons because of tremor.  I am going to try to decrease this medication to 500 mg daily and potentially decrease this further in the future.  Chelsea, let wife know that I am going to decrease med to 1 tablet instead of 2 at night.  Tell her why.  I'm not so sure he needs medication but not stopping it yet.  Go ahead and refill if he needs it for 1 daily ?

## 2021-06-06 ENCOUNTER — Other Ambulatory Visit: Payer: Self-pay

## 2021-06-06 DIAGNOSIS — G20A1 Parkinson's disease without dyskinesia, without mention of fluctuations: Secondary | ICD-10-CM

## 2021-06-06 DIAGNOSIS — G20 Parkinson's disease: Secondary | ICD-10-CM

## 2021-06-06 MED ORDER — DIVALPROEX SODIUM ER 500 MG PO TB24: 500.0000 mg | ORAL_TABLET | Freq: Every day | ORAL | 1 refills | Status: DC

## 2021-06-06 NOTE — Telephone Encounter (Signed)
Called and spoke to patients wife and explained the reduction in medication. Changed in patients chart and sent prescription to [facility] in [place]  ?

## 2021-06-23 ENCOUNTER — Other Ambulatory Visit: Payer: Self-pay | Admitting: Neurology

## 2021-06-23 DIAGNOSIS — G20 Parkinson's disease: Secondary | ICD-10-CM

## 2021-06-23 DIAGNOSIS — G20A1 Parkinson's disease without dyskinesia, without mention of fluctuations: Secondary | ICD-10-CM

## 2021-06-26 ENCOUNTER — Telehealth: Payer: Self-pay | Admitting: Neurology

## 2021-06-26 NOTE — Telephone Encounter (Signed)
Patients wife called and stated Bernard Evans was in the hospital until 4/30.  She stated the hospital told them to follow up to see if they needed to come in for a sooner appointment. ?

## 2021-06-26 NOTE — Telephone Encounter (Signed)
Called patient's wife and left voice mail message.

## 2021-08-26 ENCOUNTER — Other Ambulatory Visit: Payer: Self-pay | Admitting: Neurology

## 2021-08-26 DIAGNOSIS — G20 Parkinson's disease: Secondary | ICD-10-CM

## 2021-11-20 NOTE — Progress Notes (Unsigned)
Assessment/Plan:   1.  Parkinsonism, likely Parkinson's disease but atypical state cannot be ruled out             -Clinically, I still think he likely has Parkinson's disease.  He needs to increase his exercise.  We talked about this extensively.   -He will take carbidopa/levodopa 25/100, 2 tablets at 8 AM/2 tablets at noon/1 tablet at 4 PM.  Discussed r/b/se.  Discussed potential interactions with protein. -Continue pramipexole, 0.5 mg 3 times per day. -need to increase exercise -discussed regular daily schedule -declines home PT -gave information on RWW -Family counseling with the geriatric counselor could be very beneficial for patient and wife.  Patient is very resistant to doing this, as well as doing exercise.  This is what is frustrating to the patient's wife.  She may find counseling alone to be beneficial.     2.  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  Patient on aspirin monotherapy.  Patient with multiple stroke risk factors including hypertension, hyperlipidemia, diabetes mellitus, history of tobacco.  -MRI brain in 2023 with evidence of multiple prior infarcts.    Subjective:   Bernard Evans was seen today in follow up for parkinsonism, likely Parkinson's disease.  My previous records were reviewed prior to todays visit as well as outside records available to me.  Pt with wife who supplements hx.    Pramipexole was added last visit.  He has been doing well on that.  No sleep attacks.  No compulsive behaviors.  Some days, he has bad tremor and other days not.  Last visit, they wanted me to take over prescribing his Depakote.  I had reservations about that because he had had no history of seizures and apparently was on it for some type of seizure prevention.  We ultimately made the decision to decrease the Depakote to 500 mg daily. Wife states that he has actually been off of it for 3 weeks.  Wife  states that pcp has stopped that along with lantus and jardiance.  He is now on gemtesa for the bladder and sertraline for mood.  He has had a number of falls since last visit.  A few of them recently were in the bathroom and were in a small space.  This was Sunday.  For 5-6 weeks prior to that, he actually was doing well.  They have been dealing with high blood pressure at home.  BP's often running in the 488'Q systolic but they had it all the way up into the 200's.  Following with pcp.  Noting some sleep disruption; part of this is related to inactivity in day.    Current prescribed movement disorder medications: Carbidopa/levodopa 25/100, 2 tablets at 8 AM/2 tablets at noon/1 tablet at 4 PM Pramipexole, 0.5 mg 3 times per day (added last visit) Depakote, 500 mg daily.  PREVIOUS MEDICATIONS: Sinemet  ALLERGIES:   Allergies  Allergen Reactions   Guaifenesin Shortness Of Breath   Codeine Anxiety    CURRENT MEDICATIONS:  Current Meds  Medication Sig   albuterol (VENTOLIN HFA) 108 (90 Base) MCG/ACT inhaler Inhale into the lungs.   ALPRAZolam (XANAX) 0.25 MG tablet Take by mouth.   aspirin 325 MG EC tablet Take 1 tablet by mouth daily.   Blood Glucose Monitoring Suppl (FIFTY50 GLUCOSE METER 2.0) w/Device KIT See admin instructions.   carbidopa-levodopa (  SINEMET IR) 25-100 MG tablet TAKE 2 TABLETS BY MOUTH ONCE DAILY IN THE MORNING, AND 2 AT 12 PM, AND 1 AT 4 PM   cyanocobalamin 1000 MCG tablet Take by mouth.   ergocalciferol (VITAMIN D2) 1.25 MG (50000 UT) capsule Take 1 capsule by mouth once a week.   gabapentin (NEURONTIN) 300 MG capsule TAKE 1 CAPSULE BY MOUTH ONCE DAILY IN THE MORNING AND 2 NIGHTLY   GEMTESA 75 MG TABS Take 1 tablet by mouth daily.   Glycopyrrolate-Formoterol (BEVESPI AEROSPHERE) 9-4.8 MCG/ACT AERO Inhale into the lungs.   insulin degludec (TRESIBA) 100 UNIT/ML FlexTouch Pen 10 units at bedtime   losartan (COZAAR) 50 MG tablet Take 1 tablet by mouth 2 (two) times  daily.   magnesium oxide (MAG-OX) 400 MG tablet Take 1 tablet by mouth on Monday, Wednesday and Friday.   metFORMIN (GLUCOPHAGE-XR) 500 MG 24 hr tablet Take 2 tablets by mouth daily with breakfast.   metoprolol succinate (TOPROL-XL) 25 MG 24 hr tablet Take 2 tablets by mouth daily.   pantoprazole (PROTONIX) 40 MG tablet Take 1 tablet by mouth daily.   pramipexole (MIRAPEX) 0.5 MG tablet Take 1 tablet (0.5 mg total) by mouth 3 (three) times daily.   rosuvastatin (CRESTOR) 20 MG tablet Take 1 tablet by mouth daily.   sertraline (ZOLOFT) 50 MG tablet Take 50 mg by mouth daily.     Objective:   PHYSICAL EXAMINATION:    VITALS:   Vitals:   11/21/21 1455  BP: (!) 153/89  Pulse: 77  SpO2: 95%  Weight: 180 lb (81.6 kg)  Height: 5' 7" (1.702 m)     GEN:  The patient appears stated age and is in NAD. HEENT:  Normocephalic, atraumatic.  The mucous membranes are moist. The superficial temporal arteries are without ropiness or tenderness.  Neurological examination:  Orientation: The patient is alert and oriented x3.  Patient looks to wife for finer aspects of the history. Cranial nerves: There is good facial symmetry.  There is facial hypomimia.  Extraocular muscles are intact. The visual fields are full to confrontational testing. The speech is fluent and clear. Soft palate rises symmetrically and there is no tongue deviation. Hearing is intact to conversational tone. Sensation: Sensation is intact to light touch throughout  Motor: Strength is 5/5 in the bilateral upper and lower extremities.   Shoulder shrug is equal and symmetric.  There is no pronator drift. Deep tendon reflexes: Deep tendon reflexes are 3+/4 at the bilateral biceps, triceps, brachioradialis, 2+ at the bilateralpatella and achilles. Plantar responses are downgoing bilaterally.   Movement examination: Tone: There is mild increased tone in the RUE Abnormal movements: there is RLE tremor; no RUE tremor today Coordination:   There is  decremation with action of turning in light bulb on the L and toe taps on the L Gait and Station: The patient pushes off of the chair to arise. The patient ambulates with the walker (given this; comes in transport chair)     Total time spent on today's visit was 36 minutes, including both face-to-face time and nonface-to-face time.  Time included that spent on review of records (prior notes available to me/labs/imaging if pertinent), discussing treatment and goals, answering patient's questions and coordinating care.  Cc:  Myrtis Hopping., MD

## 2021-11-21 ENCOUNTER — Encounter: Payer: Self-pay | Admitting: Neurology

## 2021-11-21 ENCOUNTER — Ambulatory Visit (INDEPENDENT_AMBULATORY_CARE_PROVIDER_SITE_OTHER): Payer: Medicare Other | Admitting: Neurology

## 2021-11-21 DIAGNOSIS — G20 Parkinson's disease: Secondary | ICD-10-CM | POA: Diagnosis not present

## 2021-11-21 MED ORDER — CARBIDOPA-LEVODOPA 25-100 MG PO TABS: ORAL_TABLET | ORAL | 1 refills | Status: DC

## 2021-11-21 MED ORDER — PRAMIPEXOLE DIHYDROCHLORIDE 0.5 MG PO TABS: 0.5000 mg | ORAL_TABLET | Freq: Three times a day (TID) | ORAL | 1 refills | Status: DC

## 2021-11-21 NOTE — Patient Instructions (Signed)
Local and Online Resources for Power over Parkinson's Group September 2023  LOCAL [place] PARKINSON'S GROUPS  Power over Parkinson's Group:   Power Over Parkinson's Patient Education Group will be Wednesday, September 13th-*Hybrid meting*- in person at [place] [facility] location and via WEBEX at 2 pm.   Upcoming Power over Parkinson's Meetings:  2nd Wednesdays of the month at 2 pm:  September 13th, October 11th, November 8th Contact Amy Marriott at [email or web address] if interested in participating in this group Parkinson's Care Partners Group:    3rd Mondays, Contact Misty Paladino Atypical Parkinsonian Patient Group:   4th Wednesdays, Contact Misty Paladino If you are interested in participating in these groups with Misty, please contact her directly for how to join those meetings.  Her contact information is [email or web address].    LOCAL EVENTS AND NEW OFFERINGS New PWR! Moves Community Fitness Instructor-Led Classes offering at [employer]!  Wednesdays 1-2 pm.   Contact Christy Weaver at  [email or web address] or Mike Sabin at [employer], [email or web address] Dance for Parkinson 's classes will be on Tuesdays 9:30am-10:30am starting October 3-December 12 with a break the week of November 21 . Located in the [place] [facility] which is in the first floor of the [facility] ([address]  Drumming for Parkinson's will be held on 2nd and 4th Mondays at 11:00am . First class will start  September 25th.  Located at the [employer] ([address] [place] Through support from the Parkinson's Foundation, the Dance and Drumming for Parkinson's classes are free for both patients and caregivers.  Contact Misty Taylor-Paladino for more details about registering.  ONLINE EDUCATION AND SUPPORT Parkinson Foundation:  www.parkinson.org PD Health at Home continues:  Mindfulness Mondays, Wellness  Wednesdays, Fitness Fridays  Upcoming Education:   Navigating Nutrition with PD.  Wednesday, Sept. 6th 1:00-2:00 pm Understanding Mind and Memory.  Wednesday, Sept. 20th 1:00-2:00 pm  Expert Briefing:    Parkinson's Disease and the Bladder.  Wednesday, Sept. 13th 1:00-2:00 pm Parkinson's and the Gut-Brain Connection.  Wednesday, Oct. 11th 1:00-2:00 pm Register for expert briefings (webinars) at [email or web address]://[email or web address].org/resources-support/online-education/expert-[email or web address]-webinars Please check out their website to sign up for emails and see their full online offerings   Michael J Fox Foundation:  www.michaeljfox.org  Third Thursday Webinars:  On the third Thursday of every month at 12 p.m. ET, join our free live webinars to learn about various aspects of living with Parkinson's disease and our work to speed medical breakthroughs. Upcoming Webinar:  Stay tuned Check out additional information on their website to see their full online offerings  [employer] Phinney Foundation:  www.davisphinneyfoundation.org Upcoming Webinar:   Stay tuned Webinar Series:  Living with Parkinson's Meetup.   Third Thursdays each month, 3 pm Care Partner Monthly Meetup.  With Connie Carpenter Phinney.  First Tuesday of each month, 2 pm Check out additional information to Live Well Today on their website  Parkinson and Movement Disorders (PMD) Alliance:  www.pmdalliance.org [employer] Online:  Online Education Events Sign up for emails, which are sent weekly to give you updates on programming and online offerings  Parkinson's Association of [place]:  www.parkinsonassociation.org Information on online support groups, education events, and online exercises including Yoga, Parkinson's exercises and more-LOTS of information on links to PD resources and online events Virtual Support Group through Parkinson's Association of [place]; next one is scheduled for Wednesday, October 4th at 2 pm. (No September meeting  due to the symposium.  These are typically scheduled for the 1st Wednesday   of the month at 2 pm).  Visit website for details. Register for "Caring for Parkinson's-Caring for You", 9th Annual Symposium.  In-person event in [place].  September 9th.  To register:  www.parkinsonassociation.org/symposium-registration/?blm_aid=45150 MOVEMENT AND EXERCISE OPPORTUNITIES PWR! Moves Classes at [facility].  Wednesdays 10 and 11 am.   Contact Amy Marriott, PT [email or web address] if interested. NEW PWR! Moves Class offerings at [employer] [employer].  Wednesdays 1-2 pm.  Contact Christy Weaver at  [email or web address] or Mike Sabin at [employer],  [email or web address] Parkinson's Wellness Recovery (PWR! Moves)  www.pwr4life.org Info on the PWR! Virtual Experience:  You will have access to our expertise through self-assessment, guided plans that start with the PD-specific fundamentals, educational content, tips, Q&A with an expert, and a growing library of PD-specific pre-recorded and live exercise classes of varying types and intensity - both physical and cognitive! If that is not enough, we offer 1:1 wellness consultations (in-person or virtual) to personalize your PWR! Virtual Experience.  Parkinson Foundation Fitness Fridays:  As part of the PD Health @ Home program, this free video series focuses each week on one aspect of fitness designed to support people living with Parkinson's.  These weekly videos highlight the Parkinson Foundation recent fitness guidelines for people with Parkinson's disease. www.parkinson.org/resources-support/online-education/pdhealth#ff Dance for PD website is offering free, live-stream classes throughout the week, as well as links to digital library of classes:  https://danceforparkinsons.org/ Virtual dance and Pilates for Parkinson's classes: Click on the Community Tab> Parkinson's Movement Initiative Tab.  To register for classes and for more  information, visit www.americandancefestival.org and click the "community" tab.  [employer] Parkinson's Cycling Classes  [employer]:  Thursdays @ Noon-Live classes at [employer] (Contact Margaret Hazen at [email or web address] or [phone]) [employer]: Virtual Classes Mondays and Thursdays /Live classes Tuesday, Wednesday and Thursday (contact Marlee at [email or web address]  or [phone])  [employer] Varied levels of classes are offered Tuesdays and Thursdays at [facility].  Stretching with Maria weekly class is also offered for people with Parkinson's To observe a class or for more information, call [phone] or email Hillary Savage at [email or web address] ADDITIONAL SUPPORT AND RESOURCES [employer]:Online Caregiver Education Opportunities:  www.well-springsolutions.org/caregiver-education/caregiver-support-group.  You may also contact Jodi Kolada at [email or web address] or [phone].    Coping with Difficult Caregiver Emotions.  Wednesday, September 20th, 10:30 am-12.  [facility], [facility] Navigating the Maze of Senior Care Options.  Thursday, September 28th, 4-5:15 pm.  [facility]. [facility] Navigator:  Just1Navigator program, a free service to help individuals and families through the journey of determining care for older adults.  The "Navigator" is a social worker, Nicole Reynolds, who will speak with a prospective client and/or loved ones to provide an assessment of the situation and a set of recommendations for a personalized care plan -- all free of charge, and whether [facility] [employer] offers the needed service or not. If the need is not a service we provide, we are well-connected with reputable programs in town that we can refer you to.  www.well-springsolutions.org or to speak with the Navigator, call [phone].  

## 2022-05-24 NOTE — Progress Notes (Signed)
Assessment/Plan:   1.  Parkinsonism, likely Parkinson's disease but atypical state cannot be ruled out             -Clinically, I still think he likely has Parkinson's disease. -He will take carbidopa/levodopa 25/100, 2 tablets at 8 AM/2 tablets at noon/1 tablet at 4 PM.  Discussed timing of medication -Continue pramipexole, 0.5 mg 3 times per day -Talked again about exercise and given information to free exercise programs in the community.  These would be things he could participate in, including chair yoga.      2.  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  Patient on aspirin monotherapy.  Patient with multiple stroke risk factors including hypertension, hyperlipidemia, diabetes mellitus, history of tobacco.  -MRI brain in 2023 with evidence of multiple prior infarcts.    Subjective:   Bernard Evans was seen today in follow up for parkinsonism, likely Parkinson's disease.  My previous records were reviewed prior to todays visit as well as outside records available to me.  Pt with wife who supplements hx.    last visit, and previous visits, we talked about the importance of exercise.  We also talked about the importance of counseling, both for patient and his wife as they struggle to deal with the challenges of chronic disease.  Since our last visit, the patient was hospitalized for RSV and associated acute on chronic respiratory failure.  He reports this left him with a chronic cough.  In regards to Parkinson's, he continues with his levodopa and pramipexole.  He has had no hallucinations.  No near syncope.  No falls.  Continues on pramipexole and carbidopa/levodopa.  He reports not exercising.  Pts wife does state she retired about 3 weeks ago and pt is doing better and helping her.  Current prescribed movement disorder medications: Carbidopa/levodopa 25/100, 2 tablets at 8 AM/2 tablets at noon/1 tablet at 4 PM  (he's generally taking the last one at 6pm) Pramipexole, 0.5 mg 3 times per day  Depakote, 500 mg daily.  PREVIOUS MEDICATIONS: Sinemet  ALLERGIES:   Allergies  Allergen Reactions   Guaifenesin Shortness Of Breath   Codeine Anxiety    CURRENT MEDICATIONS:  Current Meds  Medication Sig   albuterol (VENTOLIN HFA) 108 (90 Base) MCG/ACT inhaler Inhale into the lungs.   ALPRAZolam (XANAX) 0.25 MG tablet Take by mouth.   aspirin 325 MG EC tablet Take 1 tablet by mouth daily.   Blood Glucose Monitoring Suppl (FIFTY50 GLUCOSE METER 2.0) w/Device KIT See admin instructions.   carbidopa-levodopa (SINEMET IR) 25-100 MG tablet TAKE 2 TABLETS BY MOUTH ONCE DAILY IN THE MORNING, AND 2 AT 12 PM, AND 1 AT 4 PM   cyanocobalamin 1000 MCG tablet Take by mouth.   gabapentin (NEURONTIN) 300 MG capsule TAKE 1 CAPSULE BY MOUTH ONCE DAILY IN THE MORNING AND 2 NIGHTLY   Glycopyrrolate-Formoterol (BEVESPI AEROSPHERE) 9-4.8 MCG/ACT AERO Inhale into the lungs.   insulin degludec (TRESIBA) 100 UNIT/ML FlexTouch Pen 10 units at bedtime   losartan (COZAAR) 50 MG tablet Take 1 tablet by mouth 2 (two) times daily.   magnesium oxide (MAG-OX) 400 MG tablet Take 1 tablet by mouth on Monday, Wednesday and Friday.   metFORMIN (GLUCOPHAGE-XR) 500 MG 24 hr tablet Take 2 tablets by mouth daily with breakfast.   metoprolol succinate (TOPROL-XL) 25 MG 24 hr tablet Take  2 tablets by mouth daily.   pantoprazole (PROTONIX) 40 MG tablet Take 1 tablet by mouth daily.   pramipexole (MIRAPEX) 0.5 MG tablet Take 1 tablet (0.5 mg total) by mouth 3 (three) times daily.   rosuvastatin (CRESTOR) 20 MG tablet Take 1 tablet by mouth daily.   sertraline (ZOLOFT) 50 MG tablet Take 50 mg by mouth daily.     Objective:   PHYSICAL EXAMINATION:    VITALS:   Vitals:   05/29/22 1031  BP: (!) 140/100  Pulse: 62  SpO2: 98%  Weight: 200 lb (90.7 kg)  Height: 5\' 9"  (1.753 m)      GEN:  The patient appears stated age and is in  NAD. HEENT:  Normocephalic, atraumatic.  The mucous membranes are moist. The superficial temporal arteries are without ropiness or tenderness. Cardiovascular: Regular rate rhythm Lungs: Clear to auscultation bilaterally.  Wearing oxygen Neck: No bruits  Neurological examination:  Orientation: The patient is alert and oriented x3.   Cranial nerves: There is good facial symmetry.  There is facial hypomimia.  Extraocular muscles are intact. The visual fields are full to confrontational testing. The speech is fluent and clear. Soft palate rises symmetrically and there is no tongue deviation. Hearing is intact to conversational tone. Sensation: Sensation is intact to light touch throughout  Motor: Strength is at least antigravity x 4.  Movement examination: Tone: There is mild increased tone in the RUE Abnormal movements: there is no tremor today Coordination:  There is no decremation today Gait and Station: The patient pushes off of the chair to arise. The patient pushes off the WC to arise and he ambulates a bit unsteady, R arm flexed.     Cc:  Myrtis Hopping., MD

## 2022-05-29 ENCOUNTER — Ambulatory Visit (INDEPENDENT_AMBULATORY_CARE_PROVIDER_SITE_OTHER): Payer: Medicare Other | Admitting: Neurology

## 2022-05-29 ENCOUNTER — Encounter: Payer: Self-pay | Admitting: Neurology

## 2022-05-29 VITALS — BP 140/100 | HR 62 | Ht 69.0 in | Wt 200.0 lb

## 2022-05-29 DIAGNOSIS — G20A1 Parkinson's disease without dyskinesia, without mention of fluctuations: Secondary | ICD-10-CM | POA: Diagnosis not present

## 2022-05-29 MED ORDER — PRAMIPEXOLE DIHYDROCHLORIDE 0.5 MG PO TABS: 0.5000 mg | ORAL_TABLET | Freq: Three times a day (TID) | ORAL | 1 refills | Status: DC

## 2022-05-29 MED ORDER — CARBIDOPA-LEVODOPA 25-100 MG PO TABS
ORAL_TABLET | ORAL | 1 refills | Status: DC
Start: 2022-05-29 — End: 2022-12-06

## 2022-12-05 NOTE — Progress Notes (Unsigned)
Assessment/Plan:   1.  Parkinsonism, likely Parkinson's disease but atypical state cannot be ruled out             -Clinically, I still think he likely has Parkinson's disease. -He will take carbidopa/levodopa 25/100, 2 tablets at 8 AM/2 tablets at noon/1 tablet at 4 PM.  Discussed timing of medication -Continue pramipexole, 0.5 mg 3 times per day -Talked again about exercise and given information to free exercise programs in the community.  These would be things he could participate in, including chair yoga.      2.  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  Patient on aspirin monotherapy.  Patient with multiple stroke risk factors including hypertension, hyperlipidemia, diabetes mellitus, history of tobacco.  -MRI brain in 2023 with evidence of multiple prior infarcts.   3.  Severe COPD  -Following with primary care.  On home O2  4.  Dysphagia  -Developed when in the hospital in August, 2024 for sepsis, severe COPD, NSTEMI and pneumonia  -repeat MBE.  They want this done at [facility] Subjective:   Caedan Sumler was seen today in follow up for parkinsonism, likely Parkinson's disease.  My previous records were reviewed prior to todays visit as well as outside records available to me.  Pt with wife who supplements hx.   patient was last seen in April, at which point no medication changes were made, short of discussing the timing he was taking his medicines.  Patient was in the hospital in August for severe/acute exacerbation of COPD, pneumonia and sepsis.  Patient also with renal insufficiency and dysphagia because of acute medical issues.  He had a swallow study in the hospital recommending nectar thick liquids because of the dysphagia, but it was also recommended that an outpatient swallow study be repeated in 3 to 4 weeks when he got home.  He did follow-up with his primary care a few weeks later.   They discussed repeating it, but patient did not want to at the time. He doesn't recall saying that and is willing to do that.   He reported a syncopal episode after his hospitalization and before he followed up with the primary care.  EMT was called and blood pressure was apparently 160/90.  Patient had passed out in the shower and it was thought that the hot shower/vasodilation was the etiology for the syncopal episode.  Patient bounced right back to the hospital just a few days later after he saw the primary care.  He was admitted September 12 for COPD/shortness of breath/pneumonia.  He reports that he is in home PT now and they come out 2 days/week.  He ambulates with a walker in the home and cane outside of the home.  No falls since d/c.    Current prescribed movement disorder medications: Carbidopa/levodopa 25/100, 2 tablets at 8 AM/2 tablets at noon/1 tablet at 4 PM  Pramipexole, 0.5 mg 3 times per day    PREVIOUS MEDICATIONS: Sinemet; depakote  ALLERGIES:   Allergies  Allergen Reactions   Guaifenesin Shortness Of Breath   Codeine Anxiety    CURRENT MEDICATIONS:  Current Meds  Medication Sig   albuterol (VENTOLIN HFA) 108 (90 Base) MCG/ACT inhaler Inhale into the lungs.   ALPRAZolam (XANAX) 0.25 MG tablet Take by mouth.   aspirin 325 MG EC tablet Take 1 tablet by mouth  daily.   Blood Glucose Monitoring Suppl (FIFTY50 GLUCOSE METER 2.0) w/Device KIT See admin instructions.   cyanocobalamin 1000 MCG tablet Take by mouth.   furosemide (LASIX) 20 MG tablet Take 1 tablet by mouth daily.   gabapentin (NEURONTIN) 300 MG capsule TAKE 1 CAPSULE BY MOUTH ONCE DAILY IN THE MORNING AND 2 NIGHTLY   Glycopyrrolate-Formoterol (BEVESPI AEROSPHERE) 9-4.8 MCG/ACT AERO Inhale into the lungs.   insulin degludec (TRESIBA) 100 UNIT/ML FlexTouch Pen 10 units at bedtime   magnesium oxide (MAG-OX) 400 MG tablet Take 1 tablet by mouth on Monday, Wednesday and Friday.   metFORMIN (GLUCOPHAGE-XR) 500 MG 24  hr tablet Take 2 tablets by mouth daily with breakfast.   pantoprazole (PROTONIX) 40 MG tablet Take 1 tablet by mouth daily.   rosuvastatin (CRESTOR) 20 MG tablet Take 1 tablet by mouth daily.   sertraline (ZOLOFT) 50 MG tablet Take 50 mg by mouth daily.   [DISCONTINUED] carbidopa-levodopa (SINEMET IR) 25-100 MG tablet TAKE 2 TABLETS BY MOUTH ONCE DAILY IN THE MORNING, AND 2 AT 12 PM, AND 1 AT 4 PM   [DISCONTINUED] pramipexole (MIRAPEX) 0.5 MG tablet Take 1 tablet (0.5 mg total) by mouth 3 (three) times daily.     Objective:   PHYSICAL EXAMINATION:    VITALS:   Vitals:   12/06/22 1056  BP: 132/74  Pulse: 81  SpO2: 95%  Weight: 200 lb 4.8 oz (90.9 kg)  Height: 5\' 9"  (1.753 m)   GEN:  The patient appears stated age and is in NAD. HEENT:  Normocephalic, atraumatic.  The mucous membranes are moist. The superficial temporal arteries are without ropiness or tenderness. Cardiovascular: Regular rate rhythm Lungs: Clear to auscultation bilaterally.  Wearing oxygen (has really heavy tank) Neck: No bruits  Neurological examination:  Orientation: The patient is alert and oriented x3.   Cranial nerves: There is good facial symmetry.  There is facial hypomimia.  Extraocular muscles are intact. The visual fields are full to confrontational testing. The speech is fluent and clear. Soft palate rises symmetrically and there is no tongue deviation. Hearing is intact to conversational tone. Sensation: Sensation is intact to light touch throughout  Motor: Strength is at least antigravity x 4.  Movement examination: Tone: There is mild increased tone in the RUE (very mild) Abnormal movements: there is mild intermittent resting tremor on the R Coordination:  There is no decremation today Gait and Station: The patient pushes off of the chair to arise. The patient pushes off the transport chair to arise and he has a cane and is ambulating fairly well.  Total time spent on today's visit was 45  minutes, including both face-to-face time and nonface-to-face time.  Time included that spent on review of records (prior notes available to me/labs/imaging if pertinent), discussing treatment and goals, answering patient's questions and coordinating care.   Cc:  Dan Maker., MD

## 2022-12-06 ENCOUNTER — Encounter: Payer: Self-pay | Admitting: Neurology

## 2022-12-06 ENCOUNTER — Ambulatory Visit: Payer: Medicare Other | Admitting: Neurology

## 2022-12-06 ENCOUNTER — Other Ambulatory Visit: Payer: Self-pay

## 2022-12-06 VITALS — BP 132/74 | HR 81 | Ht 69.0 in | Wt 200.3 lb

## 2022-12-06 DIAGNOSIS — G20A1 Parkinson's disease without dyskinesia, without mention of fluctuations: Secondary | ICD-10-CM | POA: Diagnosis not present

## 2022-12-06 DIAGNOSIS — R1310 Dysphagia, unspecified: Secondary | ICD-10-CM

## 2022-12-06 DIAGNOSIS — J449 Chronic obstructive pulmonary disease, unspecified: Secondary | ICD-10-CM

## 2022-12-06 MED ORDER — PRAMIPEXOLE DIHYDROCHLORIDE 0.5 MG PO TABS: 0.5000 mg | ORAL_TABLET | Freq: Three times a day (TID) | ORAL | 1 refills | Status: DC

## 2022-12-06 MED ORDER — CARBIDOPA-LEVODOPA 25-100 MG PO TABS: ORAL_TABLET | ORAL | 1 refills | Status: DC

## 2023-01-01 ENCOUNTER — Telehealth: Payer: Self-pay | Admitting: Neurology

## 2023-01-01 NOTE — Telephone Encounter (Signed)
Pt presented with stable to mildly improved mild-moderate oropharyngeal dysphagia characterized by delayed swallow initiation, reduced hyolaryngeal elevation/excursion, reduced tongue base retraction, mistimed and incomplete laryngeal vestibule closure, reduced laryngeal sensation in the presence of bolus material and reduced pharyngeal stripping wave. Penetration of thin liquid visualized before and during the swallow with silent aspiration during the swallow. Transient penetration visualized during the swallow with mildly thick/nectar thick liquids. Dysphagia further classified below. Consider continuing a regular diet with a range of liquids including mildly thick/nectar thick liquids with consideration of free water following oral care. Consider use of chin tuck as this appeared to reduced aspiration but ongoing penetration persisted. Pt may benefit from ongoing skilled ST services for dysphagia.  RECOMMENDATIONS MBS Recommendations: Routine Oral Care, Oral Intake Solid Recommendations: Regular Liquid Recommendations: Thin, Mildly Thick  Medication Recommendations: As-tolerated  Swallowing/Safe Feeding Strategies: Chin Tuck, Oral care before ice chips, Oral care before free water, Single cup sips, Single straw sips, Small bites/sips

## 2023-06-06 NOTE — Progress Notes (Unsigned)
 Assessment/Plan:   1.  Parkinsonism, likely Parkinson's disease but atypical state cannot be ruled out             -Clinically, I still think he likely has Parkinson's disease. -He will take carbidopa/levodopa 25/100, 2 tablets at 8 AM/2 tablets at noon/2 tablet at 4 PM.   -add carbidopa/levodopa 50/200 CR at bedtime -Continue pramipexole, 0.5 mg 3 times per day.  Dose will not be increased because of CKD. -Talked again about exercise and given information to free exercise programs in the community.  These would be things he could participate in, including chair yoga.      2.  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  Patient on aspirin monotherapy.  Patient with multiple stroke risk factors including hypertension, hyperlipidemia, diabetes mellitus, history of tobacco.  -MRI brain in 2023 with evidence of multiple prior infarcts.   3.  Severe COPD  -Following with primary care.  On home O2  4.  Dysphagia  -Developed when in the hospital in August, 2024 for sepsis, severe COPD, NSTEMI and pneumonia  - he had a repeat modified barium swallow January 01, 2023 and 03/2023 with mild to moderate oropharyngeal dysphagia.  This noted that this was stable to mildly improved compared to prior study.  Regular diet with thin/mildly thick liquids recommended.  5.  CKD  - Notes from primary care indicate that they are trying to keep them on 2 g sodium restriction and 64 ounces of fluid water restriction per day  - We will not be increasing his pramipexole because of this. Subjective:   Bernard Evans was seen today in follow up for parkinsonism, likely Parkinson's disease.  My previous records were reviewed prior to todays visit as well as outside records available to me.  Pt with wife who supplements hx.   he had a modified barium swallow January 01, 2023 with mild to moderate oropharyngeal dysphagia.  This noted that this  was stable to mildly improved compared to prior study.  Regular diet with thin/mildly thick liquids recommended.  He had another in 03/2023 that was noted to be stable.  He feels that he is doing well pretty well from that regard.  He has had no falls.  He saw his internal medicine physician yesterday and notes are reviewed.  They note that he was in the hospital (several hospitalizations and I have reviewed, although these were related to COPD and dyspnea) since last visit and his levodopa was increased to 2 po tid. I looked at his hospital records from February, and I did not see that increase noted, but he is having no problem with the 2 tablets 3 times per day.  He is having a lot of cramping in the feet and calves at night, at least 2 days/week.  Current prescribed movement disorder medications: Carbidopa/levodopa 25/100, 2 tablets at 8 AM/2 tablets at noon/1 tablet at 4 PM  (he was increased at hospital to 2 po tid) Pramipexole, 0.5 mg 3 times per day    PREVIOUS MEDICATIONS: Sinemet; depakote  ALLERGIES:   Allergies  Allergen Reactions   Guaifenesin Shortness Of Breath   Codeine Anxiety    CURRENT MEDICATIONS:  Current Meds  Medication Sig   albuterol (VENTOLIN HFA) 108 (90 Base) MCG/ACT inhaler Inhale into the lungs.   ALPRAZolam (XANAX) 0.25 MG tablet Take by mouth.  aspirin 325 MG EC tablet Take 1 tablet by mouth daily.   Blood Glucose Monitoring Suppl (FIFTY50 GLUCOSE METER 2.0) w/Device KIT See admin instructions.   carbidopa-levodopa (SINEMET IR) 25-100 MG tablet 2 tablets at 8 AM/2 tablets at noon/1 tablet at 4 PM (Patient taking differently: Take 3 tablets by mouth 3 (three) times daily.)   cyanocobalamin 1000 MCG tablet Take by mouth.   furosemide (LASIX) 20 MG tablet Take 1 tablet by mouth daily.   gabapentin (NEURONTIN) 300 MG capsule TAKE 1 CAPSULE BY MOUTH ONCE DAILY IN THE MORNING AND 2 NIGHTLY   Glycopyrrolate-Formoterol (BEVESPI AEROSPHERE) 9-4.8 MCG/ACT AERO Inhale  into the lungs.   insulin degludec (TRESIBA) 100 UNIT/ML FlexTouch Pen 10 units at bedtime   losartan (COZAAR) 50 MG tablet Take 1 tablet by mouth 2 (two) times daily.   magnesium oxide (MAG-OX) 400 MG tablet Take 1 tablet by mouth on Monday, Wednesday and Friday.   metFORMIN (GLUCOPHAGE-XR) 500 MG 24 hr tablet Take 2 tablets by mouth daily with breakfast.   metoprolol succinate (TOPROL-XL) 25 MG 24 hr tablet Take 2 tablets by mouth daily.   pantoprazole (PROTONIX) 40 MG tablet Take 1 tablet by mouth daily.   pramipexole (MIRAPEX) 0.5 MG tablet Take 1 tablet (0.5 mg total) by mouth 3 (three) times daily.   rosuvastatin (CRESTOR) 20 MG tablet Take 1 tablet by mouth daily.   sertraline (ZOLOFT) 50 MG tablet Take 50 mg by mouth daily.     Objective:   PHYSICAL EXAMINATION:    VITALS:   Vitals:   06/07/23 1050  BP: 110/80  Pulse: 67  SpO2: 99%  Height: 5\' 9"  (1.753 m)    GEN:  The patient appears stated age and is in NAD. HEENT:  Normocephalic, atraumatic.  The mucous membranes are moist. The superficial temporal arteries are without ropiness or tenderness. Cardiovascular: Regular rate rhythm Lungs: Clear to auscultation bilaterally.  Wearing oxygen (has really heavy tank) Neck: No bruits  Neurological examination:  Orientation: The patient is alert and oriented x3.   Cranial nerves: There is good facial symmetry.  There is facial hypomimia.  Extraocular muscles are intact. The speech is fluent and clear. Soft palate rises symmetrically and there is no tongue deviation. Hearing is intact to conversational tone. Sensation: Sensation is intact to light touch throughout  Motor: Strength is at least antigravity x 4.  Movement examination: Tone: There is mild increased tone in the RUE (very mild) Abnormal movements: None today Coordination:  There is no decremation today Gait and Station: Not tested today.  Total time spent on today's visit was 30 minutes, including both  face-to-face time and nonface-to-face time.  Time included that spent on review of records (prior notes available to me/labs/imaging if pertinent), discussing treatment and goals, answering patient's questions and coordinating care.   Cc:  Dan Maker., MD

## 2023-06-07 ENCOUNTER — Ambulatory Visit: Payer: PRIVATE HEALTH INSURANCE | Admitting: Neurology

## 2023-06-07 VITALS — BP 110/80 | HR 67 | Ht 69.0 in

## 2023-06-07 DIAGNOSIS — G20A1 Parkinson's disease without dyskinesia, without mention of fluctuations: Secondary | ICD-10-CM | POA: Diagnosis not present

## 2023-06-07 MED ORDER — CARBIDOPA-LEVODOPA ER 50-200 MG PO TBCR: 1.0000 | EXTENDED_RELEASE_TABLET | Freq: Every day | ORAL | 1 refills | Status: DC

## 2023-06-07 MED ORDER — CARBIDOPA-LEVODOPA 25-100 MG PO TABS
2.0000 | ORAL_TABLET | Freq: Three times a day (TID) | ORAL | Status: DC
Start: 2023-06-07 — End: 2023-09-17

## 2023-08-05 ENCOUNTER — Other Ambulatory Visit: Payer: Self-pay | Admitting: Neurology

## 2023-08-05 DIAGNOSIS — G20A1 Parkinson's disease without dyskinesia, without mention of fluctuations: Secondary | ICD-10-CM

## 2023-09-17 ENCOUNTER — Telehealth: Payer: Self-pay | Admitting: Neurology

## 2023-09-17 ENCOUNTER — Other Ambulatory Visit: Payer: Self-pay | Admitting: Neurology

## 2023-09-17 DIAGNOSIS — G20A1 Parkinson's disease without dyskinesia, without mention of fluctuations: Secondary | ICD-10-CM

## 2023-09-17 NOTE — Telephone Encounter (Signed)
 Wife called to get refill on Bernard Evans's carbi/levo-states the pharmacy has reached out too. He is completely out

## 2023-09-18 NOTE — Telephone Encounter (Signed)
Refill has been sent in.  

## 2023-10-18 ENCOUNTER — Other Ambulatory Visit: Payer: Self-pay | Admitting: Neurology

## 2023-11-14 ENCOUNTER — Other Ambulatory Visit: Payer: Self-pay | Admitting: Neurology

## 2023-11-14 DIAGNOSIS — G20A1 Parkinson's disease without dyskinesia, without mention of fluctuations: Secondary | ICD-10-CM

## 2023-11-27 ENCOUNTER — Other Ambulatory Visit: Payer: Self-pay | Admitting: Neurology

## 2023-11-27 DIAGNOSIS — G20A1 Parkinson's disease without dyskinesia, without mention of fluctuations: Secondary | ICD-10-CM

## 2023-12-29 NOTE — Progress Notes (Signed)
 ------------------------------------------------------------------------------- Attestation signed by Martine Lenell Andreas, MD at 12/29/2023 10:21 PM Patient doing better, currently off BiPAP sitting in chair.  Wife at bedside.  Renal functions today is improved, continue low-dose of Lasix.  Patient seen by PT who recommended home with caregiver. Discharge in 1 to 2 days based on clinical progression I have interviewed and examined the patient.  I have reviewed the chart including labs and imaging  and have supervised and helped formulate the plan of management with Sharman Bless MD (Resident). I agree with the findings and plan documented.  Martine Andreas  -------------------------------------------------------------------------------  General Medicine [place] [facility] Progress Note  ASSESSMENT/PLAN: SHAYLON ADEN is a 68 y.o. 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    # Sepsis of unclear etiology - Presents with intermittent confusion, increased work of breathing, increased sputum purulence/production, fever 100.9, tachycardic, CRP significantly elevated to 50, CXR with possible left retrocardiac opacity.  Will treat as pulmonary etiology given lack of urinary symptoms; however, pending UA & Ucx. PLAN: - treat COPD/CAP w/ antibiotics as below.  - U/A negative for infection - Blood & urine cultures x 2 NGTD  # Anion gap metabolic acidosis secondary to lactic acidosis - Patient had soft blood pressures overnight 10/31 so lactic acid was obtained which was elevated to 4.  Lactic has downtrended to 2.5.  Suspect lactic acidosis was in the setting  of sepsis and possible hypovolemia -Beta hydroxybutyrate was checked due to elevated blood sugar with high anion gap which was normal -Anion gap now resolved PLAN  - Continue to monitor, will repeat lactate if BP drops again   # Acute on chronic hypoxic and hypercapnic respiratory failure # COPD in exacerbation # c/f CAP - History of several COPD exacerbations.  Presents for increased work of breathing requiring noninvasive ventilation, in the setting of increased purulence and sputum production.  In the ED, was tachycardic, tachypneic, febrile to 100.9, with diffuse wheezing, and workup is significant for respiratory acidosis (although likely chronic retainer), CRP significantly elevated to 50, CXR with possible left retrocardiac opacity. PLAN: - RT eval and treat - DuoNebs every 6 - LABA/LAMA/ICS - Prednisone x 5 days EOT 11/04 - Azithromycin, ceftriaxone 5-7d  (10/31-present) - RPP & COVID/Flu/RSV: negative   # HFmrEF, poa, active - Likely not in exacerbation given chronic nature of edema and BNP wnl - Home regimen:  Jardiance, metoprolol succinate 100, spironolactone 25, Lasix 60 mg twice daily. PLAN: - Hold SGLT2 - HOLD home spironolactone in setting of low blood pressure.  - Re-start home metoprolol at 1/2 dose; plan to titrate up.  - Given improvement in creatinine, restart 1/2 dose of home lasix 60mg  PO   #Right lower extremity edema - On presentation patient was noted to have bilateral lower extremity edema.  Left lower extremity edema has since resolved but he continues to have right lower extremity pitting edema but otherwise appears euvolemic so we will obtain DVT ultrasound to rule out DVT PLAN - DVT ultrasound negative   # Abdominal distention - On admission, patient has significant abdominal distension and constipation x2d (baseline 3 BM/day). Continues to pass gas; no N/V, no abdominal tenderness, hx of or  stigmata of cirrhosis, no hx of abdominal surgeries.  - KUB  10/31: Nonobstructive calculus been (pending formal read) PLAN: - Bowel regimen   # AKI on CKD 4, poa, active - Nephrologist:  Eulas Winnifred Manes, MD   - Baseline Cr 3.2-3.55.  Presents with sCR 3.78, creatinine now up to 3.95, consistent with AKI. - Improved with IVF: likely prerenal  PLAN: - Given improvement in creatinine, restart 1/2 dose of home lasix 60mg  PO  Will hold on diuresis and fluids today to hopefully remain net even and monitor for improvement of creatinine tomorrow with holding diuresis.  If not improving tomorrow will consult nephrology - Fluid restriction & Na restriction   # Tropenemia - On admission, troponin peaked at 53.  EKG without new/obvious ischemic changes.  No chest pain.  PLAN: - Continue to monitor symptoms   Chronic Medical Problems: # DM: SSI.  Plan to start basal in the morning.   Confirm insulin pump is off. # Parkinson disease: continue home Sinemet  and pramipexole  # CVA with residual L sided deficits: continue home ASA  # Hypertension: continue home amlodipine # Insomnia: continue home trazodone # Anxiety: Hydroxyzine; will spot dose as needed.  Trying to avoid benzos due to age and anti-psychotics due to [facility] Disease  Hospital Checklist: #DVT PPX: Heparin SubQ #FEN: Adult Diet- Sodium Controlled; 2 gm Na limit; Fluid Restricted; 1500 mL Fluid #Discharge Planning: #Ethics: Full Code  Interval History: 12/29/23 Hospital Day: 2 - DVT u/s negative  No acute events overnight.  No complaints.   Wife reports he was disoriented earlier in AM; oriented on exam; patient is intermittently disoriented over the past few years.    OBJECTIVE: Vital Signs: Temp:  [97.9 F (36.6 C)-98.4 F (36.9 C)] 97.9 F (36.6 C) Heart Rate:  [76-114] 90 Resp:  [18-23] 18 BP: (96-134)/(57-104) 112/81  Physical Exam: 12/29/23 General: No acute distress. Alert and awake. HEENT: Extraocular muscles intact.  Neck: Supple, normal ROM CV: Regular rate  and rhythm. S1, S2. No murmur appreciated.  Lungs: Diminished breath sounds bilaterally. Non-labored breathing. Nasal cannula on chin & saturating 93%. Abd:  Soft, non-tender, distended.  Ext: No edema. Distal pulses present and equal.  Neuro: No focal deficits. Sensation intact. Motor strength 5/5. Psych: Normal affect. Calm and cooperative.     CBD:  Results from last 7 days  Lab Units 12/29/23 0241 12/28/23 0044 12/27/23 0932  WHITE BLOOD CELL COUNT 10*3/uL 14.07* 16.55* 8.47  RED BLOOD CELL COUNT 10*6/uL 2.75* 2.73* 3.04*  HEMOGLOBIN g/dL 9.4* 9.3* 89.5*  HEMATOCRIT % 27.9* 27.5* 30.9*  MEAN CORPUSCULAR VOLUME fL 101.5* 100.9* 101.6*  MEAN CORPUSCULAR HEMOGLOBIN pg 34.1* 34.0* 34.3*  MEAN CORPUSCULAR HEMOGLOBIN CONC g/dL 66.3 66.2 66.1  RED CELL DISTRIBUTION WIDTH % 16.6 16.9 16.9  MEAN PLATELET VOLUME fL 8.2 8.1 8.1  PLATELET COUNT 10*3/uL 230 215 219  LYMPHOCYTES RELATIVE PERCENT %  --   --  13  NEUTROPHILS RELATIVE PERCENT %  --   --  76  MONOCYTES RELATIVE PERCENT %  --   --  8  EOSINOPHILS RELATIVE PERCENT %  --   --  2  BASOPHILS RELATIVE PERCENT %  --   --  1  NEUTROPHILS ABSOLUTE COUNT 10*3/uL  --   --  6.40  LYMPHOCYTES ABSOLUTE COUNT 10*3/uL  --   --  1.10  MONOCYTES ABSOLUTE COUNT 10*3/uL  --   --  0.70  EOSINOPHILS ABSOLUTE COUNT 10*3/uL  --   --  0.20  BASOPHILS ABSOLUTE COUNT 10*3/uL  --   --  0.10   CMP:  Results from last 7 days  Lab Units 12/29/23 0241 12/28/23 0044 12/27/23 2147 12/27/23 0932  SODIUM mmol/L 139 137 137 139  POTASSIUM mmol/L 4.5 4.6* 4.5 3.5  CHLORIDE mmol/L 105 102 102 103  CO2 mmol/L 25 21 20* 25  BUN mg/dL 56* 42* 40* 37*  CREATININE mg/dL 6.37* 6.04* 6.01* 6.21*  CALCIUM mg/dL 8.6 8.7 8.7 8.9  MAGNESIUM mg/dL 2.6 2.1 2.1 2.3  PHOSPHORUS mg/dL 4.7 3.8  --   --   BILIRUBIN TOTAL mg/dL 0.3 0.4  --  0.4  AST U/L 7* 8*  --  10*  ALT U/L <3* 4*  --  <3*  TOTAL PROTEIN g/dL 6.7 7.0  --  7.3  ALBUMIN g/dL 3.6 3.6  --  3.8   ANION GAP mmol/L 9 14 15* 11     Electronically signed by: Sharman Fritz Bless, MD 12/29/2023 6:33 AM

## 2023-12-30 NOTE — Progress Notes (Deleted)
 Assessment/Plan:   1.  Parkinsonism, likely Parkinson's disease but atypical state cannot be ruled out             -Clinically, I still think he likely has Parkinson's disease. -He will take carbidopa /levodopa  25/100, 2 tablets at 8 AM/2 tablets at noon/2 tablet at 4 PM.   -add carbidopa /levodopa  50/200 CR at bedtime -Continue pramipexole , 0.5 mg 3 times per day.  Dose will not be increased because of CKD. -Talked again about exercise and given information to free exercise programs in the community.  These would be things he could participate in, including chair yoga.      2.  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  Patient on aspirin monotherapy.  Patient with multiple stroke risk factors including hypertension, hyperlipidemia, diabetes mellitus, history of tobacco.  -MRI brain in 2023 with evidence of multiple prior infarcts.   3.  Severe COPD  -Following with primary care.  On home O2.  Recent hospitalization and notes reviewed.  4.  Dysphagia  -Developed when in the hospital in August, 2024 for sepsis, severe COPD, NSTEMI and pneumonia  - he had a repeat modified barium swallow January 01, 2023 and 03/2023 with mild to moderate oropharyngeal dysphagia.  This noted that this was stable to mildly improved compared to prior study.  Regular diet with thin/mildly thick liquids recommended.  5.  CKD  - Notes from primary care indicate that they are trying to keep them on 2 g sodium restriction and 64 ounces of fluid water restriction per day  - We will not be increasing his pramipexole  because of this. Subjective:   Bernard Evans was seen today in follow up for parkinsonism, likely Parkinson's disease.  My previous records were reviewed prior to todays visit as well as outside records available to me.  Pt with wife who supplements hx.   patient remains on immediate release levodopa .  Extended release levodopa   was added at bedtime last visit.  Patient has tolerated that medication well, without side effects.  Patient remains on pramipexole  as well.  No compulsive behaviors or sleep attacks.  He has been in the hospital for acute exacerbation of copd.  Notes reviewed.  Intermittently confused during hospitalization  Current prescribed movement disorder medications: Carbidopa /levodopa  25/100, 2 tablets at 8 AM/2 tablets at noon/1 tablet at 4 PM   Carbidopa /levodopa  50/200 CR at bedtime (started last visit) Pramipexole , 0.5 mg 3 times per day    PREVIOUS MEDICATIONS: Sinemet ; depakote   ALLERGIES:   Allergies  Allergen Reactions   Guaifenesin Shortness Of Breath   Codeine Anxiety    CURRENT MEDICATIONS:  No outpatient medications have been marked as taking for the 12/31/23 encounter (Appointment) with Solange Emry, Asberry RAMAN, DO.     Objective:   PHYSICAL EXAMINATION:    VITALS:   There were no vitals filed for this visit.   GEN:  The patient appears stated age and is in NAD. HEENT:  Normocephalic, atraumatic.  The mucous membranes are moist. The superficial temporal arteries are without ropiness or tenderness. Cardiovascular: Regular rate rhythm Lungs: Clear to auscultation bilaterally.  Wearing oxygen (has really heavy tank) Neck: No bruits  Neurological examination:  Orientation: The patient is alert and oriented x3.   Cranial nerves: There is good facial symmetry.  There is facial hypomimia.  Extraocular muscles are intact. The speech is fluent  and clear. Soft palate rises symmetrically and there is no tongue deviation. Hearing is intact to conversational tone. Sensation: Sensation is intact to light touch throughout  Motor: Strength is at least antigravity x 4.  Movement examination: Tone: There is mild increased tone in the RUE (very mild) Abnormal movements: None today Coordination:  There is no decremation today Gait and Station: Not tested today.  Total time spent on today's  visit was *** minutes, including both face-to-face time and nonface-to-face time.  Time included that spent on review of records (prior notes available to me/labs/imaging if pertinent), discussing treatment and goals, answering patient's questions and coordinating care.   Cc:  Geroge Charlie CROME., MD

## 2023-12-31 ENCOUNTER — Ambulatory Visit: Payer: PRIVATE HEALTH INSURANCE | Admitting: Neurology

## 2023-12-31 ENCOUNTER — Telehealth: Payer: Self-pay | Admitting: Neurology

## 2023-12-31 NOTE — Telephone Encounter (Signed)
 Pt wife called and LM with AN. To cancel appt today due to Jaymeson being discharged from hospital yesterday

## 2024-02-12 ENCOUNTER — Other Ambulatory Visit: Payer: Self-pay | Admitting: Neurology

## 2024-02-12 DIAGNOSIS — G20A1 Parkinson's disease without dyskinesia, without mention of fluctuations: Secondary | ICD-10-CM

## 2024-02-17 ENCOUNTER — Other Ambulatory Visit: Payer: Self-pay | Admitting: Neurology

## 2024-02-17 DIAGNOSIS — G20A1 Parkinson's disease without dyskinesia, without mention of fluctuations: Secondary | ICD-10-CM

## 2024-03-29 DEATH — deceased

## 2024-06-10 ENCOUNTER — Ambulatory Visit: Admitting: Neurology
